# Patient Record
Sex: Female | Born: 1945 | Race: White | Hispanic: No | Marital: Single | Smoking: Former smoker
Health system: Southern US, Community
[De-identification: ages and names within clinical notes are randomized; demographics above are authoritative.]

## PROBLEM LIST (undated history)

## (undated) DIAGNOSIS — R011 Cardiac murmur, unspecified: Secondary | ICD-10-CM

## (undated) DIAGNOSIS — K279 Peptic ulcer, site unspecified, unspecified as acute or chronic, without hemorrhage or perforation: Secondary | ICD-10-CM

## (undated) DIAGNOSIS — I219 Acute myocardial infarction, unspecified: Secondary | ICD-10-CM

## (undated) DIAGNOSIS — E079 Disorder of thyroid, unspecified: Secondary | ICD-10-CM

## (undated) DIAGNOSIS — K219 Gastro-esophageal reflux disease without esophagitis: Secondary | ICD-10-CM

## (undated) DIAGNOSIS — K000 Anodontia: Secondary | ICD-10-CM

## (undated) DIAGNOSIS — K08109 Complete loss of teeth, unspecified cause, unspecified class: Secondary | ICD-10-CM

## (undated) DIAGNOSIS — I2510 Atherosclerotic heart disease of native coronary artery without angina pectoris: Secondary | ICD-10-CM

## (undated) DIAGNOSIS — R809 Proteinuria, unspecified: Secondary | ICD-10-CM

## (undated) DIAGNOSIS — N182 Chronic kidney disease, stage 2 (mild): Secondary | ICD-10-CM

## (undated) DIAGNOSIS — I10 Essential (primary) hypertension: Secondary | ICD-10-CM

## (undated) HISTORY — PX: TONSILLECTOMY: SUR1361

## (undated) HISTORY — PX: APPENDECTOMY: SHX54

## (undated) HISTORY — PX: CORONARY ANGIOPLASTY WITH STENT PLACEMENT: SHX49

## (undated) HISTORY — PX: TUBAL LIGATION: SHX77

---

## 2014-12-18 ENCOUNTER — Encounter (HOSPITAL_BASED_OUTPATIENT_CLINIC_OR_DEPARTMENT_OTHER): Payer: Self-pay

## 2014-12-18 ENCOUNTER — Emergency Department (HOSPITAL_BASED_OUTPATIENT_CLINIC_OR_DEPARTMENT_OTHER): Payer: Medicare HMO

## 2014-12-18 ENCOUNTER — Emergency Department (HOSPITAL_BASED_OUTPATIENT_CLINIC_OR_DEPARTMENT_OTHER)
Admission: EM | Admit: 2014-12-18 | Discharge: 2014-12-19 | Disposition: A | Discharge status: Home / Self Care | Payer: Medicare HMO | Attending: Emergency Medicine | Admitting: Emergency Medicine

## 2014-12-18 DIAGNOSIS — Z87891 Personal history of nicotine dependence: Secondary | ICD-10-CM | POA: Insufficient documentation

## 2014-12-18 DIAGNOSIS — I129 Hypertensive chronic kidney disease with stage 1 through stage 4 chronic kidney disease, or unspecified chronic kidney disease: Secondary | ICD-10-CM | POA: Diagnosis not present

## 2014-12-18 DIAGNOSIS — Z8711 Personal history of peptic ulcer disease: Secondary | ICD-10-CM | POA: Insufficient documentation

## 2014-12-18 DIAGNOSIS — R509 Fever, unspecified: Secondary | ICD-10-CM

## 2014-12-18 DIAGNOSIS — I2510 Atherosclerotic heart disease of native coronary artery without angina pectoris: Secondary | ICD-10-CM | POA: Insufficient documentation

## 2014-12-18 DIAGNOSIS — N390 Urinary tract infection, site not specified: Secondary | ICD-10-CM | POA: Diagnosis not present

## 2014-12-18 DIAGNOSIS — M791 Myalgia: Secondary | ICD-10-CM | POA: Insufficient documentation

## 2014-12-18 DIAGNOSIS — N182 Chronic kidney disease, stage 2 (mild): Secondary | ICD-10-CM | POA: Insufficient documentation

## 2014-12-18 DIAGNOSIS — Z79899 Other long term (current) drug therapy: Secondary | ICD-10-CM | POA: Insufficient documentation

## 2014-12-18 DIAGNOSIS — I252 Old myocardial infarction: Secondary | ICD-10-CM | POA: Diagnosis not present

## 2014-12-18 DIAGNOSIS — H578 Other specified disorders of eye and adnexa: Secondary | ICD-10-CM | POA: Insufficient documentation

## 2014-12-18 DIAGNOSIS — E079 Disorder of thyroid, unspecified: Secondary | ICD-10-CM | POA: Insufficient documentation

## 2014-12-18 DIAGNOSIS — Z9861 Coronary angioplasty status: Secondary | ICD-10-CM | POA: Insufficient documentation

## 2014-12-18 DIAGNOSIS — Z792 Long term (current) use of antibiotics: Secondary | ICD-10-CM | POA: Diagnosis not present

## 2014-12-18 DIAGNOSIS — J069 Acute upper respiratory infection, unspecified: Secondary | ICD-10-CM

## 2014-12-18 DIAGNOSIS — K219 Gastro-esophageal reflux disease without esophagitis: Secondary | ICD-10-CM | POA: Diagnosis not present

## 2014-12-18 DIAGNOSIS — Z880 Allergy status to penicillin: Secondary | ICD-10-CM | POA: Insufficient documentation

## 2014-12-18 DIAGNOSIS — R011 Cardiac murmur, unspecified: Secondary | ICD-10-CM | POA: Insufficient documentation

## 2014-12-18 HISTORY — DX: Complete loss of teeth, unspecified cause, unspecified class: K08.109

## 2014-12-18 HISTORY — DX: Proteinuria, unspecified: R80.9

## 2014-12-18 HISTORY — DX: Atherosclerotic heart disease of native coronary artery without angina pectoris: I25.10

## 2014-12-18 HISTORY — DX: Anodontia: K00.0

## 2014-12-18 HISTORY — DX: Disorder of thyroid, unspecified: E07.9

## 2014-12-18 HISTORY — DX: Acute myocardial infarction, unspecified: I21.9

## 2014-12-18 HISTORY — DX: Gastro-esophageal reflux disease without esophagitis: K21.9

## 2014-12-18 HISTORY — DX: Cardiac murmur, unspecified: R01.1

## 2014-12-18 HISTORY — DX: Essential (primary) hypertension: I10

## 2014-12-18 HISTORY — DX: Chronic kidney disease, stage 2 (mild): N18.2

## 2014-12-18 HISTORY — DX: Peptic ulcer, site unspecified, unspecified as acute or chronic, without hemorrhage or perforation: K27.9

## 2014-12-18 LAB — CBC WITH DIFFERENTIAL/PLATELET
BASOS ABS: 0 10*3/uL (ref 0.0–0.1)
Basophils Relative: 0 %
EOS ABS: 0 10*3/uL (ref 0.0–0.7)
EOS PCT: 0 %
HCT: 38.4 % (ref 36.0–46.0)
HEMOGLOBIN: 12.7 g/dL (ref 12.0–15.0)
LYMPHS ABS: 1.4 10*3/uL (ref 0.7–4.0)
LYMPHS PCT: 14 %
MCH: 26.9 pg (ref 26.0–34.0)
MCHC: 33.1 g/dL (ref 30.0–36.0)
MCV: 81.4 fL (ref 78.0–100.0)
Monocytes Absolute: 1.2 10*3/uL — ABNORMAL HIGH (ref 0.1–1.0)
Monocytes Relative: 12 %
NEUTROS PCT: 74 %
Neutro Abs: 7.4 10*3/uL (ref 1.7–7.7)
PLATELETS: 164 10*3/uL (ref 150–400)
RBC: 4.72 MIL/uL (ref 3.87–5.11)
RDW: 15.5 % (ref 11.5–15.5)
WBC: 10.1 10*3/uL (ref 4.0–10.5)

## 2014-12-18 LAB — URINALYSIS, ROUTINE W REFLEX MICROSCOPIC
Bilirubin Urine: NEGATIVE
Glucose, UA: NEGATIVE mg/dL
Ketones, ur: NEGATIVE mg/dL
Nitrite: POSITIVE — AB
SPECIFIC GRAVITY, URINE: 1.015 (ref 1.005–1.030)
pH: 6 (ref 5.0–8.0)

## 2014-12-18 LAB — COMPREHENSIVE METABOLIC PANEL
ALT: 21 U/L (ref 14–54)
AST: 30 U/L (ref 15–41)
Albumin: 3.5 g/dL (ref 3.5–5.0)
Alkaline Phosphatase: 89 U/L (ref 38–126)
Anion gap: 10 (ref 5–15)
BILIRUBIN TOTAL: 1 mg/dL (ref 0.3–1.2)
BUN: 19 mg/dL (ref 6–20)
CHLORIDE: 99 mmol/L — AB (ref 101–111)
CO2: 20 mmol/L — ABNORMAL LOW (ref 22–32)
Calcium: 8.7 mg/dL — ABNORMAL LOW (ref 8.9–10.3)
Creatinine, Ser: 1.02 mg/dL — ABNORMAL HIGH (ref 0.44–1.00)
GFR, EST NON AFRICAN AMERICAN: 55 mL/min — AB (ref >60)
Glucose, Bld: 122 mg/dL — ABNORMAL HIGH (ref 65–99)
POTASSIUM: 3.7 mmol/L (ref 3.5–5.1)
Sodium: 129 mmol/L — ABNORMAL LOW (ref 135–145)
TOTAL PROTEIN: 7.8 g/dL (ref 6.5–8.1)

## 2014-12-18 LAB — URINE MICROSCOPIC-ADD ON

## 2014-12-18 LAB — I-STAT CG4 LACTIC ACID, ED
LACTIC ACID, VENOUS: 1.43 mmol/L (ref 0.5–2.0)
LACTIC ACID, VENOUS: 1.08 mmol/L (ref 0.5–2.0)

## 2014-12-18 MED ORDER — VANCOMYCIN HCL 500 MG IV SOLR
INTRAVENOUS | Status: AC
  Filled 2014-12-18: qty 2000

## 2014-12-18 MED ORDER — VANCOMYCIN HCL 10 G IV SOLR
2000.0000 mg | Freq: Once | INTRAVENOUS | Status: AC
  Administered 2014-12-18: 2000 mg via INTRAVENOUS
  Filled 2014-12-18: qty 2000

## 2014-12-18 MED ORDER — LEVOFLOXACIN IN D5W 750 MG/150ML IV SOLN: 750.0000 mg | INTRAVENOUS | Status: DC

## 2014-12-18 MED ORDER — AZTREONAM 1 G IJ SOLR
INTRAMUSCULAR | Status: AC
  Administered 2014-12-18: 2 g
  Filled 2014-12-18: qty 2

## 2014-12-18 MED ORDER — ONDANSETRON 4 MG PO TBDP: 4.0000 mg | ORAL_TABLET | Freq: Three times a day (TID) | ORAL | Status: DC | PRN

## 2014-12-18 MED ORDER — SODIUM CHLORIDE 0.9 % IV SOLN
1250.0000 mg | Freq: Two times a day (BID) | INTRAVENOUS | Status: DC
  Filled 2014-12-18: qty 1250

## 2014-12-18 MED ORDER — ACETAMINOPHEN 325 MG PO TABS
650.0000 mg | ORAL_TABLET | Freq: Once | ORAL | Status: AC
  Administered 2014-12-18: 650 mg via ORAL
  Filled 2014-12-18: qty 2

## 2014-12-18 MED ORDER — VANCOMYCIN HCL IN DEXTROSE 1-5 GM/200ML-% IV SOLN: 1000.0000 mg | Freq: Once | INTRAVENOUS | Status: DC

## 2014-12-18 MED ORDER — DEXTROSE 5 % IV SOLN: 2.0000 g | Freq: Three times a day (TID) | INTRAVENOUS | Status: DC

## 2014-12-18 MED ORDER — LEVOFLOXACIN IN D5W 750 MG/150ML IV SOLN
750.0000 mg | Freq: Once | INTRAVENOUS | Status: AC
  Administered 2014-12-18: 750 mg via INTRAVENOUS
  Filled 2014-12-18: qty 150

## 2014-12-18 MED ORDER — CEPHALEXIN 500 MG PO CAPS: 500.0000 mg | ORAL_CAPSULE | Freq: Four times a day (QID) | ORAL | Status: DC

## 2014-12-18 MED ORDER — AZTREONAM 1 G IJ SOLR
INTRAMUSCULAR | Status: AC
  Filled 2014-12-18: qty 1

## 2014-12-18 MED ORDER — SODIUM CHLORIDE 0.9 % IV BOLUS (SEPSIS)
1000.0000 mL | INTRAVENOUS | Status: AC
  Administered 2014-12-18: 1000 mL via INTRAVENOUS

## 2014-12-18 MED ORDER — DEXTROSE 5 % IV SOLN
2.0000 g | Freq: Once | INTRAVENOUS | Status: AC
  Administered 2014-12-18: 2 g via INTRAVENOUS

## 2014-12-18 NOTE — ED Notes (Signed)
Reports that she believed she had a UTI. C/o dysuria, flank pain with malodorous urine since Saturday night.

## 2014-12-18 NOTE — Discharge Instructions (Signed)
Take antibiotic as directed. If your urinary tract infection symptoms of pain with urination do not resolve in 2 days return because in the advice not working. There was no evidence of pneumonia and the rest of your workup in the believe it may be a viral upper respiratory infection. Also return for any new or worse symptoms with that. But the antibiotic will not help that. Take antinausea medicine as directed. Again return for any new or worse symptoms or if not feeling better in a couple days.

## 2014-12-18 NOTE — ED Provider Notes (Signed)
CSN: [id number]     Arrival date & time 12/18/14  1729 History  By signing my name below, I, Rhonda Burnett, attest that this documentation has been prepared under the direction and in the presence of Rhonda Sorrow, MD. Electronically Signed: Meriel Burnett, ED Scribe. 12/18/2014. 7:26 PM.    Chief Complaint  Patient presents with  . Fever   The history is provided by the patient. No language interpreter was used.   HPI Comments: Rhonda Burnett is a 69 y.o. female, with a significant PMhx with cardiac stents, who presents to the Emergency Department complaining of an intermittent fever onset 1 day ago that worsened last night with onset of associated chills and a Tmax of 1004.1F. She associates chest congestion, a productive cough, nausea, vomiting, rhinorrhea and generalized myalgias. She additionally c/o burning dysuria X 3 days. Pt is febrile at 102.62F on triage vitals. She received her influenza and PNA vaccinations 4 months ago. The pt admits to taking hypertension medication daily and reports her BP runs [id number] systolic at baseline.    Past Medical History  Diagnosis Date  . MI (myocardial infarction) ([place])   . Hypertension   . Thyroid disease   . GERD (gastroesophageal reflux disease)   . Peptic ulcer   . Coronary artery disease   . CKD (chronic kidney disease) stage 2, GFR 60-89 ml/min   . Edentulous   . Heart murmur   . Microalbuminuria    Past Surgical History  Procedure Laterality Date  . Coronary angioplasty with stent placement    . Appendectomy    . Tonsillectomy    . Tubal ligation     No family history on file. Social History  Substance Use Topics  . Smoking status: Former Research scientist (life sciences)  . Smokeless tobacco: None  . Alcohol Use: Yes     Comment: rare   OB History    No data available     Review of Systems  Constitutional: Positive for fever and chills.  HENT: Positive for congestion and rhinorrhea. Negative for sore throat.   Eyes: Positive for visual  disturbance ( 'I could not focus').  Respiratory: Positive for cough and shortness of breath ( on exertion).   Cardiovascular: Negative for chest pain and leg swelling.  Gastrointestinal: Positive for nausea and vomiting. Negative for abdominal pain and diarrhea.  Genitourinary: Positive for dysuria ( burning). Negative for hematuria.  Musculoskeletal: Positive for myalgias ( generalized). Negative for back pain and neck pain.  Skin: Negative for rash.  Neurological: Positive for headaches ( intermittent).  Hematological: Does not bruise/bleed easily.  Psychiatric/Behavioral: Negative for confusion.   Allergies  Asa; Iodine; and Penicillins  Home Medications   Prior to Admission medications   Medication Sig Start Date End Date Taking? Authorizing Provider  amLODipine (NORVASC) 5 MG tablet Take 5 mg by mouth daily.   Yes Historical Provider, MD  colesevelam (WELCHOL) 625 MG tablet Take 625 mg by mouth 2 (two) times daily with a meal.   Yes Historical Provider, MD  diphenhydrAMINE (SOMINEX) 25 MG tablet Take 25 mg by mouth at bedtime as needed for sleep.   Yes Historical Provider, MD  doxycycline (VIBRAMYCIN) 100 MG capsule Take 100 mg by mouth 2 (two) times daily.   Yes Historical Provider, MD  famotidine (PEPCID) 20 MG tablet Take 20 mg by mouth 2 (two) times daily.   Yes Historical Provider, MD  levothyroxine (SYNTHROID, LEVOTHROID) 25 MCG tablet Take 25 mcg by mouth daily before breakfast.   Yes Historical  Provider, MD  losartan-hydrochlorothiazide (HYZAAR) 100-25 MG tablet Take 1 tablet by mouth daily.   Yes Historical Provider, MD  metoprolol succinate (TOPROL-XL) 50 MG 24 hr tablet Take 50 mg by mouth daily. Take with or immediately following a meal.   Yes Historical Provider, MD  nitroGLYCERIN (NITROSTAT) 0.4 MG SL tablet Place 0.4 mg under the tongue every 5 (five) minutes as needed for chest pain.   Yes Historical Provider, MD  omeprazole (PRILOSEC) 20 MG capsule Take 20 mg by  mouth daily.   Yes Historical Provider, MD  cephALEXin (KEFLEX) 500 MG capsule Take 1 capsule (500 mg total) by mouth 4 (four) times daily. 12/18/14   Rhonda Sorrow, MD  ondansetron (ZOFRAN ODT) 4 MG disintegrating tablet Take 1 tablet (4 mg total) by mouth every 8 (eight) hours as needed for nausea or vomiting. 12/18/14   Rhonda Sorrow, MD   Triage Vitals: BP 147/67 mmHg  Pulse 99  Temp(Src) 102.3 F (39.1 C) (Oral)  Resp 28  Ht 5\' 7"  (1.702 m)  Wt 209 lb (94.802 kg)  BMI 32.73 kg/m2  SpO2 98% Physical Exam  Constitutional: She is oriented to person, place, and time. She appears well-developed and well-nourished. No distress.  HENT:  Head: Normocephalic and atraumatic.  Mucous membranes dry.   Eyes: Conjunctivae and EOM are normal. Pupils are equal, round, and reactive to light. No scleral icterus.  Neck: Normal range of motion.  Cardiovascular: Normal rate, regular rhythm and normal heart sounds.   Pulmonary/Chest: Effort normal and breath sounds normal. No respiratory distress. She has no wheezes. She has no rales.  Good air movement bilaterally. No swelling in bilateral ankles.   Abdominal: Soft. Bowel sounds are normal. She exhibits no distension. There is no tenderness.  Musculoskeletal: Normal range of motion.  Neurological: She is alert and oriented to person, place, and time. No cranial nerve deficit. She exhibits normal muscle tone. Coordination normal.  Skin: Skin is warm and dry.  Psychiatric: She has a normal mood and affect. Judgment normal.  Nursing note and vitals reviewed.   ED Course  Procedures  DIAGNOSTIC STUDIES: Oxygen Saturation is 98% on RA, normal by my interpretation.    COORDINATION OF CARE: 6:21 PM Code sepsis initiated.  7:09 PM Discussed treatment plan with pt. Pt acknowledges and agrees to plan.   Labs Review Labs Reviewed  COMPREHENSIVE METABOLIC PANEL - Abnormal; Notable for the following:    Sodium 129 (*)    Chloride 99 (*)    CO2 20  (*)    Glucose, Bld 122 (*)    Creatinine, Ser 1.02 (*)    Calcium 8.7 (*)    GFR calc non Af Amer 55 (*)    All other components within normal limits  CBC WITH DIFFERENTIAL/PLATELET - Abnormal; Notable for the following:    Monocytes Absolute 1.2 (*)    All other components within normal limits  URINALYSIS, ROUTINE W REFLEX MICROSCOPIC (NOT AT [facility]) - Abnormal; Notable for the following:    Color, Urine AMBER (*)    APPearance TURBID (*)    Hgb urine dipstick MODERATE (*)    Protein, ur >300 (*)    Nitrite POSITIVE (*)    Leukocytes, UA LARGE (*)    All other components within normal limits  URINE MICROSCOPIC-ADD ON - Abnormal; Notable for the following:    Squamous Epithelial / LPF 6-30 (*)    Bacteria, UA MANY (*)    All other components within normal limits  CULTURE,  BLOOD (ROUTINE X 2)  CULTURE, BLOOD (ROUTINE X 2)  URINE CULTURE  I-STAT CG4 LACTIC ACID, ED  I-STAT CG4 LACTIC ACID, ED   Results for orders placed or performed during the hospital encounter of 12/18/14  Comprehensive metabolic panel  Result Value Ref Range   Sodium 129 (L) 135 - 145 mmol/L   Potassium 3.7 3.5 - 5.1 mmol/L   Chloride 99 (L) 101 - 111 mmol/L   CO2 20 (L) 22 - 32 mmol/L   Glucose, Bld 122 (H) 65 - 99 mg/dL   BUN 19 6 - 20 mg/dL   Creatinine, Ser 1.02 (H) 0.44 - 1.00 mg/dL   Calcium 8.7 (L) 8.9 - 10.3 mg/dL   Total Protein 7.8 6.5 - 8.1 g/dL   Albumin 3.5 3.5 - 5.0 g/dL   AST 30 15 - 41 U/L   ALT 21 14 - 54 U/L   Alkaline Phosphatase 89 38 - 126 U/L   Total Bilirubin 1.0 0.3 - 1.2 mg/dL   GFR calc non Af Amer 55 (L) >60 mL/min   GFR calc Af Amer >60 >60 mL/min   Anion gap 10 5 - 15  CBC WITH DIFFERENTIAL  Result Value Ref Range   WBC 10.1 4.0 - 10.5 K/uL   RBC 4.72 3.87 - 5.11 MIL/uL   Hemoglobin 12.7 12.0 - 15.0 g/dL   HCT 38.4 36.0 - 46.0 %   MCV 81.4 78.0 - 100.0 fL   MCH 26.9 26.0 - 34.0 pg   MCHC 33.1 30.0 - 36.0 g/dL   RDW 15.5 11.5 - 15.5 %   Platelets 164 150 - 400 K/uL    Neutrophils Relative % 74 %   Neutro Abs 7.4 1.7 - 7.7 K/uL   Lymphocytes Relative 14 %   Lymphs Abs 1.4 0.7 - 4.0 K/uL   Monocytes Relative 12 %   Monocytes Absolute 1.2 (H) 0.1 - 1.0 K/uL   Eosinophils Relative 0 %   Eosinophils Absolute 0.0 0.0 - 0.7 K/uL   Basophils Relative 0 %   Basophils Absolute 0.0 0.0 - 0.1 K/uL  Urinalysis, Routine w reflex microscopic (not at [facility])  Result Value Ref Range   Color, Urine AMBER (A) YELLOW   APPearance TURBID (A) CLEAR   Specific Gravity, Urine 1.015 1.005 - 1.030   pH 6.0 5.0 - 8.0   Glucose, UA NEGATIVE NEGATIVE mg/dL   Hgb urine dipstick MODERATE (A) NEGATIVE   Bilirubin Urine NEGATIVE NEGATIVE   Ketones, ur NEGATIVE NEGATIVE mg/dL   Protein, ur >300 (A) NEGATIVE mg/dL   Nitrite POSITIVE (A) NEGATIVE   Leukocytes, UA LARGE (A) NEGATIVE  Urine microscopic-add on  Result Value Ref Range   Squamous Epithelial / LPF 6-30 (A) NONE SEEN   WBC, UA TOO NUMEROUS TO COUNT 0 - 5 WBC/hpf   RBC / HPF 6-30 0 - 5 RBC/hpf   Bacteria, UA MANY (A) NONE SEEN  I-Stat CG4 Lactic Acid, ED  (not at  [facility])  Result Value Ref Range   Lactic Acid, Venous 1.43 0.5 - 2.0 mmol/L  I-Stat CG4 Lactic Acid, ED  (not at  [facility])  Result Value Ref Range   Lactic Acid, Venous 1.08 0.5 - 2.0 mmol/L     Imaging Review Dg Chest 2 View  12/18/2014  CLINICAL DATA:  Cough, fever and shortness of breath. EXAM: CHEST  2 VIEW COMPARISON:  None. FINDINGS: Normal sized heart. Clear lungs. Mild diffuse peribronchial thickening. Unremarkable bones. IMPRESSION: Mild bronchitic changes. Electronically Signed  By: Claudie Revering M.D.   On: 12/18/2014 18:52   Ct Chest Wo Contrast  12/18/2014  CLINICAL DATA:  Acute onset of cough, fever and lower back pain. Initial encounter. EXAM: CT CHEST WITHOUT CONTRAST TECHNIQUE: Multidetector CT imaging of the chest was performed following the standard protocol without IV contrast. COMPARISON:  Chest radiograph performed earlier today at  6:37 p.m. FINDINGS: Two 8 mm nodules are noted within the right lower lobe (images 38 and 44 of 56). Additional scattered tiny nodules are noted throughout both lungs. This most likely reflects sequelae of remote infection, though metastatic disease could conceivably have a similar appearance. The lungs are otherwise clear. No focal consolidation, pleural effusion or pneumothorax is seen. Diffuse coronary artery calcifications are noted. The mediastinum is otherwise unremarkable. No mediastinal lymphadenopathy is seen. No pericardial effusion is identified. Scattered calcification is noted along the thoracic aorta. The great vessels are grossly unremarkable. The visualized portions of thyroid gland are unremarkable. No axillary lymphadenopathy is seen. The spleen is enlarged, measuring 15.3 cm in length. The visualized portions of the liver are grossly unremarkable. No acute osseous abnormalities are seen. IMPRESSION: 1. Scattered tiny nodules throughout both lungs, including two 8 mm nodules at the right lower lobe. This most likely reflects sequelae of remote infection, though metastatic disease could conceivably have a similar appearance. PET/CT could be considered for further evaluation, on an elective nonemergent basis. 2. Lungs otherwise clear. 3. Diffuse coronary artery calcifications seen. 4. Splenomegaly noted. Electronically Signed   By: Garald Balding M.D.   On: 12/18/2014 19:56   I have personally reviewed and evaluated these images and lab results as part of my medical decision-making.   EKG Interpretation   Date/Time:  Monday December 18 2014 18:42:35 EST Ventricular Rate:  88 PR Interval:  180 QRS Duration: 98 QT Interval:  376 QTC Calculation: [id number] R Axis:   44 Text Interpretation:  Normal sinus rhythm Left ventricular hypertrophy  with repolarization abnormality Abnormal ECG No previous ECGs available  Confirmed by Redmond Whittley  MD, Arya Luttrull [phone]) on 12/18/2014 6:48:56 PM      MDM    Final diagnoses:  Fever, unspecified fever cause  UTI (lower urinary tract infection)  URI (upper respiratory infection)    Patient presented with seem to be like flulike symptoms. Fever some concerns perhaps for early sepsis. Sepsis protocol started. Patient's lactic acid was not elevated. Chest x-ray was negative for pneumonia. CT chest was done for confirmation. Patient does have some pulmonary nodules that will require follow-up. Patient urinalysis significantly abnormal. Treatment for this will be required. Urine culture sent patient did have blood cultures patient to give broad-spectrum antibiotics due to the concern for sepsis that should initially cover the urinary tract infection. Patient will be continued on Keflex does have an allergy to penicillin but has not had trouble with cephalosporins in the past. Patient's sodium was slightly low patient showed some evidence of some mild dehydration. Patient never had hypotension or significant tachycardia. Patient overall states she feels much much better once to go home. Will discharge home continue treating the urinary tract infection with Keflex and will treat her nausea and vomiting with Zofran. Believe that the upper rest for infection is probably viral. And obviously the urinary tract infection is bacterial.    I personally performed the services described in this documentation, which was scribed in my presence. The recorded information has been reviewed and is accurate.      Rhonda Sorrow, MD 12/18/14 [phone]

## 2014-12-18 NOTE — Progress Notes (Signed)
ANTIBIOTIC CONSULT NOTE - INITIAL  Pharmacy Consult for vancomycin, aztreonam, levaquin Indication: sepsis  Allergies  Allergen Reactions  . Asa [Aspirin]   . Iodine   . Penicillins     Patient Measurements: Height: 5\' 7"  (170.2 cm) Weight: 209 lb (94.802 kg) IBW/kg (Calculated) : 61.6  Vital Signs: Temp: 102.3 F (39.1 C) (11/28 1749) Temp Source: Oral (11/28 1749) BP: 147/67 mmHg (11/28 1749) Pulse Rate: 99 (11/28 1749) Intake/Output from previous day:   Intake/Output from this shift:    Labs: No results for input(s): WBC, HGB, PLT, LABCREA, CREATININE in the last 72 hours. CrCl cannot be calculated (Patient has no serum creatinine result on file.). No results for input(s): VANCOTROUGH, VANCOPEAK, VANCORANDOM, GENTTROUGH, GENTPEAK, GENTRANDOM, TOBRATROUGH, TOBRAPEAK, TOBRARND, AMIKACINPEAK, AMIKACINTROU, AMIKACIN in the last 72 hours.   Microbiology: No results found for this or any previous visit (from the past 720 hour(s)).  Medical History: Past Medical History  Diagnosis Date  . MI (myocardial infarction) ([place])   . Hypertension   . Thyroid disease   . GERD (gastroesophageal reflux disease)   . Peptic ulcer   . Coronary artery disease   . CKD (chronic kidney disease) stage 2, GFR 60-89 ml/min   . Edentulous   . Heart murmur   . Microalbuminuria     Assessment: 69 yo f presenting to the ED on 11/28 with fever, productive cough, and dysuria. Pharmacy is consulted to dose vancomycin, aztreonam (PCN allergy), and levaquin for sepsis. Patient has already received loads in the ED.  Wbc 10.1, tmax 102.3, SCr 1.02, CrCl ~62 ml/min, LA 1.43.   Vanc 11/28 >> Aztreonam 11/28 >> Levaquin 11/28 >>  11/28 BCx: 11/28 UCx:  Goal of Therapy:  Vancomycin trough level 15-20 mcg/ml  Plan:  Levaquin 750 mg IV q24h Aztreonam 2 gm IV q8h Vancomycin 1250 mg IV q12h VT at Css Monitor renal fx, cbc, cx, clinical course  Jahnavi Muratore L. Nicole Kindred, PharmD PGY2 Infectious  Diseases Pharmacy Resident Pager: [phone] 12/18/2014 7:18 PM

## 2014-12-18 NOTE — ED Notes (Signed)
Fever, prod cough, dysuria, lower back pain x 2 days

## 2014-12-19 ENCOUNTER — Telehealth (HOSPITAL_COMMUNITY): Payer: Self-pay

## 2014-12-19 NOTE — Telephone Encounter (Signed)
Dr Rogene Houston reviewed chart. Advised to have pt to return for further evaluation if continues to have fevers or feeling worse. Attempted to contact pt. Left message on answering machine

## 2014-12-19 NOTE — Telephone Encounter (Signed)
Spoke with pt. Informed of labs. States she is feeling much better. No fever today. No vomiting.  Will follow up with PCP tomorrow. If fever returns or symptoms come back she will return to Ed

## 2014-12-20 LAB — URINE CULTURE: Culture: >=100000

## 2014-12-21 ENCOUNTER — Telehealth (HOSPITAL_BASED_OUTPATIENT_CLINIC_OR_DEPARTMENT_OTHER): Payer: Self-pay | Admitting: Emergency Medicine

## 2014-12-21 LAB — CULTURE, BLOOD (ROUTINE X 2)

## 2014-12-21 NOTE — Telephone Encounter (Signed)
Post ED Visit - Positive Culture Follow-up  Culture report reviewed by antimicrobial stewardship pharmacist:  []  Elenor Quinones, Pharm.D. []  Heide Guile, Pharm.D., BCPS [x]  Parks Neptune, Pharm.D. []  Alycia Rossetti, Pharm.D., BCPS []  [place], Pharm.D., BCPS, AAHIVP []  Legrand Como, Pharm.D., BCPS, AAHIVP []  Milus Glazier, Pharm.D. []  Stephens November, [place].D.  Positive urine culture E. coli Treated with doxycycline and cephalexin, organism sensitive to the same and no further patient follow-up is required at this time.  Hazle Nordmann 12/21/2014, 11:29 AM

## 2014-12-22 ENCOUNTER — Emergency Department (HOSPITAL_BASED_OUTPATIENT_CLINIC_OR_DEPARTMENT_OTHER)
Admission: EM | Admit: 2014-12-22 | Discharge: 2014-12-22 | Disposition: A | Discharge status: Home / Self Care | Payer: Medicare HMO | Attending: Emergency Medicine | Admitting: Emergency Medicine

## 2014-12-22 ENCOUNTER — Telehealth (HOSPITAL_COMMUNITY): Payer: Self-pay | Admitting: Pharmacist Clinician (PhC)/ Clinical Pharmacy Specialist

## 2014-12-22 ENCOUNTER — Encounter (HOSPITAL_BASED_OUTPATIENT_CLINIC_OR_DEPARTMENT_OTHER): Payer: Self-pay | Admitting: *Deleted

## 2014-12-22 DIAGNOSIS — Z8711 Personal history of peptic ulcer disease: Secondary | ICD-10-CM | POA: Insufficient documentation

## 2014-12-22 DIAGNOSIS — Z87891 Personal history of nicotine dependence: Secondary | ICD-10-CM | POA: Insufficient documentation

## 2014-12-22 DIAGNOSIS — N12 Tubulo-interstitial nephritis, not specified as acute or chronic: Secondary | ICD-10-CM | POA: Insufficient documentation

## 2014-12-22 DIAGNOSIS — E079 Disorder of thyroid, unspecified: Secondary | ICD-10-CM | POA: Insufficient documentation

## 2014-12-22 DIAGNOSIS — I129 Hypertensive chronic kidney disease with stage 1 through stage 4 chronic kidney disease, or unspecified chronic kidney disease: Secondary | ICD-10-CM | POA: Insufficient documentation

## 2014-12-22 DIAGNOSIS — Z792 Long term (current) use of antibiotics: Secondary | ICD-10-CM | POA: Insufficient documentation

## 2014-12-22 DIAGNOSIS — N182 Chronic kidney disease, stage 2 (mild): Secondary | ICD-10-CM | POA: Diagnosis not present

## 2014-12-22 DIAGNOSIS — Z0000 Encounter for general adult medical examination without abnormal findings: Secondary | ICD-10-CM | POA: Diagnosis not present

## 2014-12-22 DIAGNOSIS — I252 Old myocardial infarction: Secondary | ICD-10-CM | POA: Insufficient documentation

## 2014-12-22 DIAGNOSIS — K219 Gastro-esophageal reflux disease without esophagitis: Secondary | ICD-10-CM | POA: Diagnosis not present

## 2014-12-22 DIAGNOSIS — Z79899 Other long term (current) drug therapy: Secondary | ICD-10-CM | POA: Insufficient documentation

## 2014-12-22 DIAGNOSIS — Z09 Encounter for follow-up examination after completed treatment for conditions other than malignant neoplasm: Secondary | ICD-10-CM

## 2014-12-22 DIAGNOSIS — R011 Cardiac murmur, unspecified: Secondary | ICD-10-CM | POA: Diagnosis not present

## 2014-12-22 DIAGNOSIS — Z880 Allergy status to penicillin: Secondary | ICD-10-CM | POA: Diagnosis not present

## 2014-12-22 LAB — COMPREHENSIVE METABOLIC PANEL
ALT: 24 U/L (ref 14–54)
AST: 32 U/L (ref 15–41)
Albumin: 3 g/dL — ABNORMAL LOW (ref 3.5–5.0)
Alkaline Phosphatase: 79 U/L (ref 38–126)
Anion gap: 6 (ref 5–15)
BUN: 11 mg/dL (ref 6–20)
CHLORIDE: 106 mmol/L (ref 101–111)
CO2: 24 mmol/L (ref 22–32)
CREATININE: 0.72 mg/dL (ref 0.44–1.00)
Calcium: 9 mg/dL (ref 8.9–10.3)
GFR calc non Af Amer: >60 mL/min (ref >60)
Glucose, Bld: 85 mg/dL (ref 65–99)
Potassium: 4 mmol/L (ref 3.5–5.1)
SODIUM: 136 mmol/L (ref 135–145)
Total Bilirubin: 0.6 mg/dL (ref 0.3–1.2)
Total Protein: 7.3 g/dL (ref 6.5–8.1)

## 2014-12-22 LAB — URINALYSIS, ROUTINE W REFLEX MICROSCOPIC
Bilirubin Urine: NEGATIVE
Glucose, UA: NEGATIVE mg/dL
Ketones, ur: NEGATIVE mg/dL
LEUKOCYTES UA: NEGATIVE
NITRITE: NEGATIVE
PH: 6.5 (ref 5.0–8.0)
Protein, ur: 100 mg/dL — AB
SPECIFIC GRAVITY, URINE: 1.009 (ref 1.005–1.030)

## 2014-12-22 LAB — URINE MICROSCOPIC-ADD ON

## 2014-12-22 LAB — CBC WITH DIFFERENTIAL/PLATELET
BASOS ABS: 0 10*3/uL (ref 0.0–0.1)
BASOS PCT: 0 %
EOS ABS: 0.1 10*3/uL (ref 0.0–0.7)
EOS PCT: 3 %
HCT: 35.4 % — ABNORMAL LOW (ref 36.0–46.0)
Hemoglobin: 11.7 g/dL — ABNORMAL LOW (ref 12.0–15.0)
LYMPHS ABS: 1.1 10*3/uL (ref 0.7–4.0)
Lymphocytes Relative: 22 %
MCH: 26.8 pg (ref 26.0–34.0)
MCHC: 33.1 g/dL (ref 30.0–36.0)
MCV: 81.2 fL (ref 78.0–100.0)
Monocytes Absolute: 0.5 10*3/uL (ref 0.1–1.0)
Monocytes Relative: 10 %
Neutro Abs: 3.4 10*3/uL (ref 1.7–7.7)
Neutrophils Relative %: 65 %
PLATELETS: 151 10*3/uL (ref 150–400)
RBC: 4.36 MIL/uL (ref 3.87–5.11)
RDW: 15.5 % (ref 11.5–15.5)
WBC: 5.2 10*3/uL (ref 4.0–10.5)

## 2014-12-22 LAB — I-STAT CG4 LACTIC ACID, ED: Lactic Acid, Venous: 1.05 mmol/L (ref 0.5–2.0)

## 2014-12-22 MED ORDER — CIPROFLOXACIN HCL 500 MG PO TABS: 500.0000 mg | ORAL_TABLET | Freq: Two times a day (BID) | ORAL | Status: AC

## 2014-12-22 NOTE — Progress Notes (Signed)
ED Antimicrobial Stewardship Positive Culture Follow Up   Rhonda Burnett is an 69 y.o. female who presented to [facility] on (Not on file) with a chief complaint of No chief complaint on file.   Recent Results (from the past 720 hour(s))  Blood Culture (routine x 2)     Status: None   Collection Time: 12/18/14  6:20 PM  Result Value Ref Range Status   Specimen Description BLOOD RIGHT ANTECUBITAL  Final   Special Requests BOTTLES DRAWN AEROBIC AND ANAEROBIC 5CC  Final   Culture  Setup Time   Final    GRAM NEGATIVE RODS ANAEROBIC BOTTLE ONLY CRITICAL RESULT CALLED TO, READ BACK BY AND VERIFIED WITH: T FESTERMAN,RN AT 1414 12/19/14 BY L BENFIELD    Culture   Final    ESCHERICHIA COLI Performed at [facility]    Report Status 12/21/2014 FINAL  Final   Organism ID, Bacteria ESCHERICHIA COLI  Final      Susceptibility   Escherichia coli - MIC*    AMPICILLIN >=32 RESISTANT Resistant     CEFAZOLIN <=4 SENSITIVE Sensitive     CEFEPIME <=1 SENSITIVE Sensitive     CEFTAZIDIME <=1 SENSITIVE Sensitive     CEFTRIAXONE <=1 SENSITIVE Sensitive     CIPROFLOXACIN <=0.25 SENSITIVE Sensitive     GENTAMICIN <=1 SENSITIVE Sensitive     IMIPENEM <=0.25 SENSITIVE Sensitive     TRIMETH/SULFA >=320 RESISTANT Resistant     AMPICILLIN/SULBACTAM 8 SENSITIVE Sensitive     PIP/TAZO <=4 SENSITIVE Sensitive     * ESCHERICHIA COLI  Blood Culture (routine x 2)     Status: None   Collection Time: 12/18/14  6:40 PM  Result Value Ref Range Status   Specimen Description BLOOD LEFT AC  Final   Special Requests BOTTLES DRAWN AEROBIC AND ANAEROBIC 5CC EACH  Final   Culture  Setup Time   Final    GRAM NEGATIVE RODS AEROBIC BOTTLE ONLY CRITICAL RESULT CALLED TO, READ BACK BY AND VERIFIED WITH: T FESTERMAN,RN  AT 1627 12/19/14 BY L BENFIELD    Culture   Final    ESCHERICHIA COLI SUSCEPTIBILITIES PERFORMED ON PREVIOUS CULTURE WITHIN THE LAST 5 DAYS. Performed at [facility]    Report Status  12/21/2014 FINAL  Final  Urine culture     Status: None   Collection Time: 12/18/14  7:20 PM  Result Value Ref Range Status   Specimen Description URINE, CLEAN CATCH  Final   Special Requests NONE  Final   Culture   Final    >=100,000 COLONIES/mL ESCHERICHIA COLI Performed at [facility]    Report Status 12/20/2014 FINAL  Final   Organism ID, Bacteria ESCHERICHIA COLI  Final      Susceptibility   Escherichia coli - MIC*    AMPICILLIN >=32 RESISTANT Resistant     CEFAZOLIN <=4 SENSITIVE Sensitive     CEFTRIAXONE <=1 SENSITIVE Sensitive     CIPROFLOXACIN <=0.25 SENSITIVE Sensitive     GENTAMICIN <=1 SENSITIVE Sensitive     IMIPENEM <=0.25 SENSITIVE Sensitive     NITROFURANTOIN <=16 SENSITIVE Sensitive     TRIMETH/SULFA >=320 RESISTANT Resistant     AMPICILLIN/SULBACTAM 4 SENSITIVE Sensitive     PIP/TAZO <=4 SENSITIVE Sensitive     * >=100,000 COLONIES/mL ESCHERICHIA COLI    69 yo who came back to the ED today to eval her bacteremia. Overall, she is doing better but she was only prescribed for 7 days of Keflex previously and since it's  bacteremia, cephalexin is not the best choice for it. D/w Dr Viviana Simpler today and we are going to change the Keflex to Cipro instead since it has good data for both pyelo/UTI/bacteremia. It's sensitive to Cipro. She was advised that if she has any further symptoms, then return to the ED for further eval. Spoke to the pt directly and instructed her about the change. Called the pharmacy and verified that they have received the scripts.  New antibiotic prescription:   Stop Keflex Start Cipro 500mg  PO BID x 7 days F/u with PCP on Monday  ED Provider: Dr Merideth Abbey, PharmD Pager: [phone] Infectious Diseases Pharmacist Phone# [phone]

## 2014-12-22 NOTE — ED Notes (Signed)
C/o uti since Monday and was seen here for same then and was notified that she had bacteria in blood and was told to come back for a recheck.

## 2014-12-22 NOTE — Progress Notes (Signed)
ED Antimicrobial Stewardship Positive Culture Follow Up   Rhonda Burnett is an 69 y.o. female who presented to [facility] on 12/18/2014 with a chief complaint of  Chief Complaint  Patient presents with  . Fever    Recent Results (from the past 720 hour(s))  Blood Culture (routine x 2)     Status: None   Collection Time: 12/18/14  6:20 PM  Result Value Ref Range Status   Specimen Description BLOOD RIGHT ANTECUBITAL  Final   Special Requests BOTTLES DRAWN AEROBIC AND ANAEROBIC 5CC  Final   Culture  Setup Time   Final    GRAM NEGATIVE RODS ANAEROBIC BOTTLE ONLY CRITICAL RESULT CALLED TO, READ BACK BY AND VERIFIED WITH: T FESTERMAN,RN AT 1414 12/19/14 BY L BENFIELD    Culture   Final    ESCHERICHIA COLI Performed at [facility]    Report Status 12/21/2014 FINAL  Final   Organism ID, Bacteria ESCHERICHIA COLI  Final      Susceptibility   Escherichia coli - MIC*    AMPICILLIN >=32 RESISTANT Resistant     CEFAZOLIN <=4 SENSITIVE Sensitive     CEFEPIME <=1 SENSITIVE Sensitive     CEFTAZIDIME <=1 SENSITIVE Sensitive     CEFTRIAXONE <=1 SENSITIVE Sensitive     CIPROFLOXACIN <=0.25 SENSITIVE Sensitive     GENTAMICIN <=1 SENSITIVE Sensitive     IMIPENEM <=0.25 SENSITIVE Sensitive     TRIMETH/SULFA >=320 RESISTANT Resistant     AMPICILLIN/SULBACTAM 8 SENSITIVE Sensitive     PIP/TAZO <=4 SENSITIVE Sensitive     * ESCHERICHIA COLI  Blood Culture (routine x 2)     Status: None   Collection Time: 12/18/14  6:40 PM  Result Value Ref Range Status   Specimen Description BLOOD LEFT AC  Final   Special Requests BOTTLES DRAWN AEROBIC AND ANAEROBIC 5CC EACH  Final   Culture  Setup Time   Final    GRAM NEGATIVE RODS AEROBIC BOTTLE ONLY CRITICAL RESULT CALLED TO, READ BACK BY AND VERIFIED WITH: T FESTERMAN,RN  AT 1627 12/19/14 BY L BENFIELD    Culture   Final    ESCHERICHIA COLI SUSCEPTIBILITIES PERFORMED ON PREVIOUS CULTURE WITHIN THE LAST 5 DAYS. Performed at [facility]     Report Status 12/21/2014 FINAL  Final  Urine culture     Status: None   Collection Time: 12/18/14  7:20 PM  Result Value Ref Range Status   Specimen Description URINE, CLEAN CATCH  Final   Special Requests NONE  Final   Culture   Final    >=100,000 COLONIES/mL ESCHERICHIA COLI Performed at [facility]    Report Status 12/20/2014 FINAL  Final   Organism ID, Bacteria ESCHERICHIA COLI  Final      Susceptibility   Escherichia coli - MIC*    AMPICILLIN >=32 RESISTANT Resistant     CEFAZOLIN <=4 SENSITIVE Sensitive     CEFTRIAXONE <=1 SENSITIVE Sensitive     CIPROFLOXACIN <=0.25 SENSITIVE Sensitive     GENTAMICIN <=1 SENSITIVE Sensitive     IMIPENEM <=0.25 SENSITIVE Sensitive     NITROFURANTOIN <=16 SENSITIVE Sensitive     TRIMETH/SULFA >=320 RESISTANT Resistant     AMPICILLIN/SULBACTAM 4 SENSITIVE Sensitive     PIP/TAZO <=4 SENSITIVE Sensitive     * >=100,000 COLONIES/mL ESCHERICHIA COLI    Ivin Booty presented on 11/28 for fevers, chills, dysuria. Her Tm was 102.3 in the ED. It was concern for possible early sepsis. Her UA was dirty so she  was to be continue on keflex for the suspected UTI along with her Doxy? Her blood cx came back with E.coli 2/2. It's sens to cefazolin but we can't correlate the breakpoints to PO Keflex. Talked to her today and she stated that she feels better but still has some intermittent low grade fever (once Tm ~100). After discussion with Vernie Shanks, PA and ED physician, we are going to bring her back to reassessment of the bacteremia. If we are going to use PO abx, Cipro may be the best bet since it has correlation with breakpoints for bacteremia. Called the pt and she is going to come back to the ED.    ED Provider: Margarita Mail, PA and ED physician   Onnie Boer, PharmD Infectious Diseases Pharmacist Phone# [phone]

## 2014-12-22 NOTE — Discharge Instructions (Signed)
Return for worsening symptoms, including fever, vomiting and unable to keep down food/fluids, confusion, worsening pain, or any other symptoms concerning to you.  Pyelonephritis, Adult Pyelonephritis is a kidney infection. The kidneys are organs that help clean your blood by moving waste out of your blood and into your pee (urine). This infection can happen quickly, or it can last for a long time. In most cases, it clears up with treatment and does not cause other problems. HOME CARE Medicines  Take over-the-counter and prescription medicines only as told by your doctor.  Take your antibiotic medicine as told by your doctor. Do not stop taking the medicine even if you start to feel better. General Instructions  Drink enough fluid to keep your pee clear or pale yellow.  Avoid caffeine, tea, and carbonated drinks.  Pee (urinate) often. Avoid holding in pee for long periods of time.  Pee before and after sex.  After pooping (having a bowel movement), women should wipe from front to back. Use each tissue only once.  Keep all follow-up visits as told by your doctor. This is important. GET HELP IF:  You do not feel better after 2 days.  Your symptoms get worse.  You have a fever. GET HELP RIGHT AWAY IF:  You cannot take your medicine or drink fluids as told.  You have chills and shaking.  You throw up (vomit).  You have very bad pain in your side (flank) or back.  You feel very weak or you pass out (faint).   This information is not intended to replace advice given to you by your health care provider. Make sure you discuss any questions you have with your health care provider.   Document Released: 02/14/2004 Document Revised: 09/27/2014 Document Reviewed: 05/01/2014 Elsevier Interactive Patient Education [employer].

## 2014-12-22 NOTE — ED Provider Notes (Addendum)
CSN: [id number]     Arrival date & time 12/22/14  1038 History   First MD Initiated Contact with Patient 12/22/14 1103     No chief complaint on file.    (Consider location/radiation/quality/duration/timing/severity/associated sxs/prior Treatment) HPI 69 year old female who presents for reevaluation. Has a history of CAD status post stent, hypertension, and CKD. I was recently seen in the emergency department 3 days ago for fever, chills, myalgias, dysuria, and back pain. Presentation was concerning for sepsis at that time, and she had evidence of pyelonephritis. Was given IV antibiotics, and per patient request she was discharged home. Blood cultures were drawn at that time and grew positive for Escherichia coli. Urine culture also positive for Escherichia coli. Patient was updated on her blood work today, and sent to the ED for reevaluation. She states that she has an additional follow-up appointment with her primary care provider in 2 days. Has been taking Keflex as prescribed. States that overall she has been feeling better. Has not had recurrence of fevers or chills. Does occasionally feel nauseous, but has no vomiting. Urinary symptoms have resolved, and her flank pain had fully resolved as well. Feels slightly weak, but says her overall symptoms have significantly improved over the course of the past few days.   Past Medical History  Diagnosis Date  . MI (myocardial infarction) ([place])   . Hypertension   . Thyroid disease   . GERD (gastroesophageal reflux disease)   . Peptic ulcer   . Coronary artery disease   . CKD (chronic kidney disease) stage 2, GFR 60-89 ml/min   . Edentulous   . Heart murmur   . Microalbuminuria    Past Surgical History  Procedure Laterality Date  . Coronary angioplasty with stent placement    . Appendectomy    . Tonsillectomy    . Tubal ligation     No family history on file. Social History  Substance Use Topics  . Smoking status: Former Research scientist (life sciences)  .  Smokeless tobacco: None  . Alcohol Use: Yes     Comment: rare   OB History    No data available     Review of Systems 10/14 systems reviewed and are negative other than those stated in the HPI   Allergies  Asa; Iodine; and Penicillins  Home Medications   Prior to Admission medications   Medication Sig Start Date End Date Taking? Authorizing Provider  amLODipine (NORVASC) 5 MG tablet Take 5 mg by mouth daily.   Yes Historical Provider, MD  cephALEXin (KEFLEX) 500 MG capsule Take 1 capsule (500 mg total) by mouth 4 (four) times daily. 12/18/14  Yes Fredia Sorrow, MD  colesevelam (WELCHOL) 625 MG tablet Take 625 mg by mouth 2 (two) times daily with a meal.   Yes Historical Provider, MD  diphenhydrAMINE (SOMINEX) 25 MG tablet Take 25 mg by mouth at bedtime as needed for sleep.   Yes Historical Provider, MD  levothyroxine (SYNTHROID, LEVOTHROID) 25 MCG tablet Take 25 mcg by mouth daily before breakfast.   Yes Historical Provider, MD  losartan-hydrochlorothiazide (HYZAAR) 100-25 MG tablet Take 1 tablet by mouth daily.   Yes Historical Provider, MD  metoprolol succinate (TOPROL-XL) 50 MG 24 hr tablet Take 50 mg by mouth daily. Take with or immediately following a meal.   Yes Historical Provider, MD  nitroGLYCERIN (NITROSTAT) 0.4 MG SL tablet Place 0.4 mg under the tongue every 5 (five) minutes as needed for chest pain.   Yes Historical Provider, MD  omeprazole (PRILOSEC) 20  MG capsule Take 20 mg by mouth daily.   Yes Historical Provider, MD  ondansetron (ZOFRAN ODT) 4 MG disintegrating tablet Take 1 tablet (4 mg total) by mouth every 8 (eight) hours as needed for nausea or vomiting. 12/18/14  Yes Fredia Sorrow, MD   BP 173/81 mmHg  Pulse 72  Temp(Src) 98.2 F (36.8 C) (Oral)  Resp 20  Ht 5\' 7"  (1.702 m)  Wt 210 lb (95.255 kg)  BMI 32.88 kg/m2  SpO2 99% Physical Exam Physical Exam  Nursing note and vitals reviewed. Constitutional: Well developed, well nourished, non-toxic, and  in no acute distress Head: Normocephalic and atraumatic.  Mouth/Throat: Oropharynx is clear and moist.  Neck: Normal range of motion. Neck supple.  Cardiovascular: Normal rate and regular rhythm.   Pulmonary/Chest: Effort normal and breath sounds normal.  Abdominal: Soft. There is no tenderness. There is no rebound and no guarding. No CVA tenderness.  Musculoskeletal: Normal range of motion.  Neurological: Alert, no facial droop, fluent speech, moves all extremities symmetrically Skin: Skin is warm and dry.  Psychiatric: Cooperative   ED Course  Procedures (including critical care time) Labs Review Labs Reviewed  CBC WITH DIFFERENTIAL/PLATELET - Abnormal; Notable for the following:    Hemoglobin 11.7 (*)    HCT 35.4 (*)    All other components within normal limits  COMPREHENSIVE METABOLIC PANEL - Abnormal; Notable for the following:    Albumin 3.0 (*)    All other components within normal limits  URINALYSIS, ROUTINE W REFLEX MICROSCOPIC (NOT AT [facility]) - Abnormal; Notable for the following:    Hgb urine dipstick TRACE (*)    Protein, ur 100 (*)    All other components within normal limits  URINE MICROSCOPIC-ADD ON - Abnormal; Notable for the following:    Squamous Epithelial / LPF 0-5 (*)    Bacteria, UA RARE (*)    All other components within normal limits  I-STAT CG4 LACTIC ACID, ED    Imaging Review No results found. I have personally reviewed and evaluated these images and lab results as part of my medical decision-making.   EKG Interpretation None      MDM   Final diagnoses:  Follow-up examination  Pyelonephritis    In short, this is a 69 year old female who presents with reevaluation after recent blood cultures and urine cultures grew positive for Escherichia coli. She is well-appearing and in no acute distress. Her vital signs are not concerning here in the emergency department with persistent sepsis. Her blood work including CBC, comp metabolic profile, and  lactic acid not concerning. Her urinalysis shows resolving infection. She has a soft and benign abdomen and no evidence of persistent CVA tenderness. She appears well-hydrated, and the remainder of her exam is unremarkable. Clinically she does appear improved from current antibiotics and doses of IV antibiotics several days prior. At this time I do not feel that she requires admission for further IV antibiotics as she is recovering well from her illness. She will have 2 day follow-up with her primary care provider and I given her strict return instructions and warning signs as well. She expressed understanding of all discharge instructions and felt comfortable with the plan of care.  Patient was called on the phone at 4:30PM 12/22/14 after discussion with the pharmacist that her bacteremia is better covered with ciprofloxacin rather than keflex. Plan to call in course of ciprofloxacin to patient. Patient expressed understanding of medication changes. Again, reviewed strict return instructions if any worsening symptoms, such as  fever, chills, fatigue, N/V, pain, etc.  Forde Dandy, MD 12/22/14 Rodessa Steffani Dionisio, MD 12/24/14 [phone]

## 2015-03-29 DIAGNOSIS — C50911 Malignant neoplasm of unspecified site of right female breast: Secondary | ICD-10-CM | POA: Insufficient documentation

## 2016-09-18 IMAGING — CT CT CHEST W/O CM
1 series · 15 of 31 positions shown, 19 images · non-contrast
Comparison: Chest radiograph performed earlier today at [DATE] p.m.

CLINICAL DATA: Acute onset of cough, fever and lower back pain.
Initial encounter.

EXAM:
CT CHEST WITHOUT CONTRAST
TECHNIQUE: Multidetector CT imaging of the chest was performed following the
standard protocol without IV contrast.

[Series 2: chest 5.0 b31f · axial · 0.66mm/px · z∈[+1134,+1384]mm · 15 of 56 slices shown, 19 images]
[im 3/56  mediastinal]
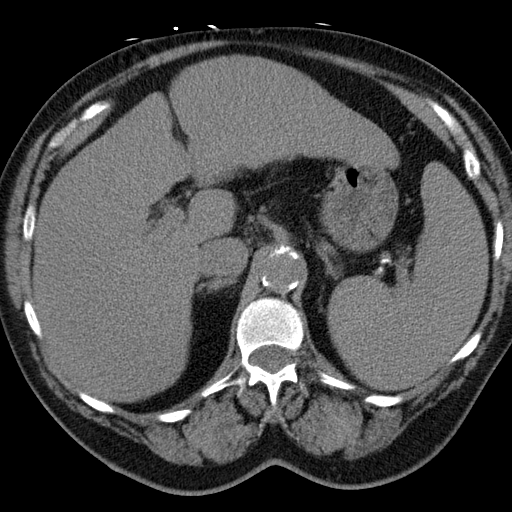
[im 3/56  lung]
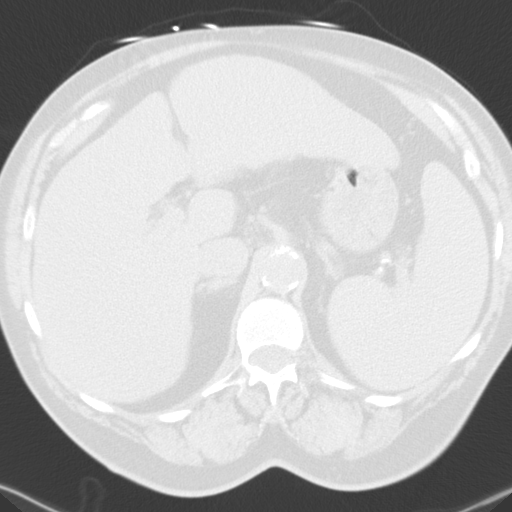
[im 7/56  lung]
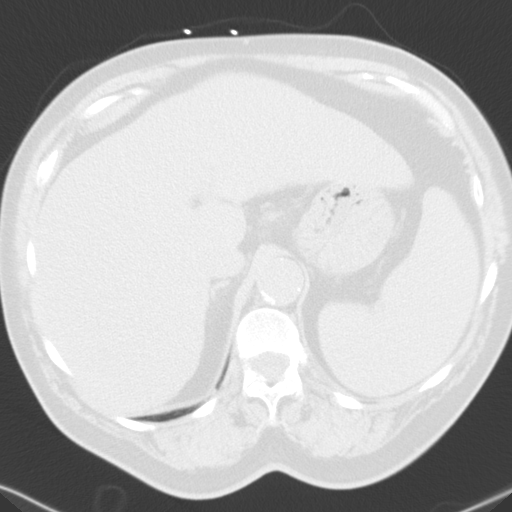
[im 11/56  lung]
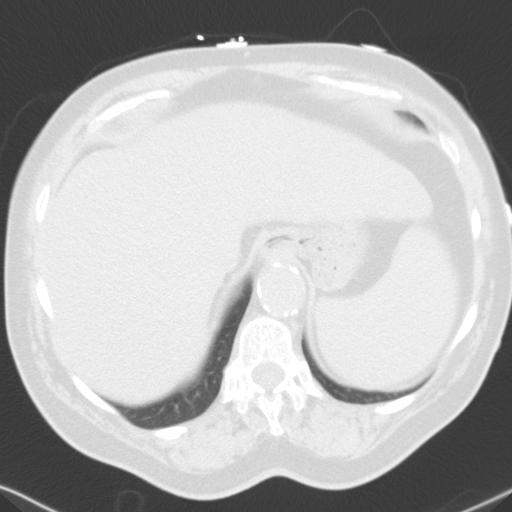
[im 13/56  lung]
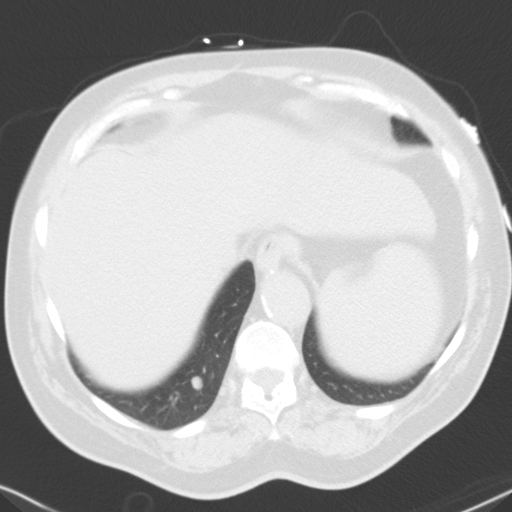
[im 17/56  mediastinal]
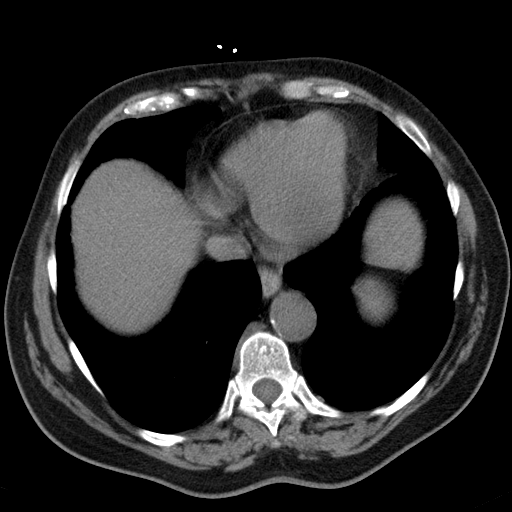
[im 17/56  lung]
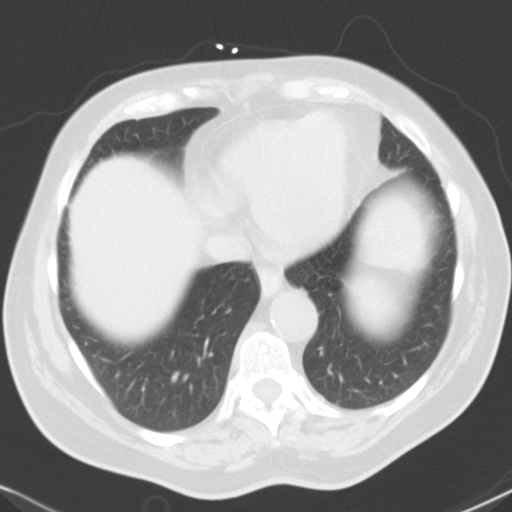
[im 21/56  lung]
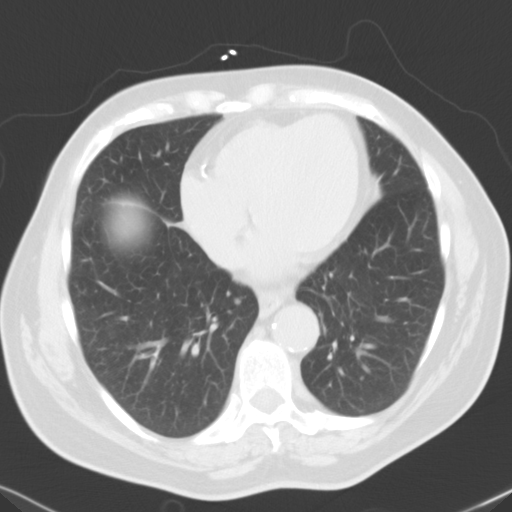
[im 25/56  lung]
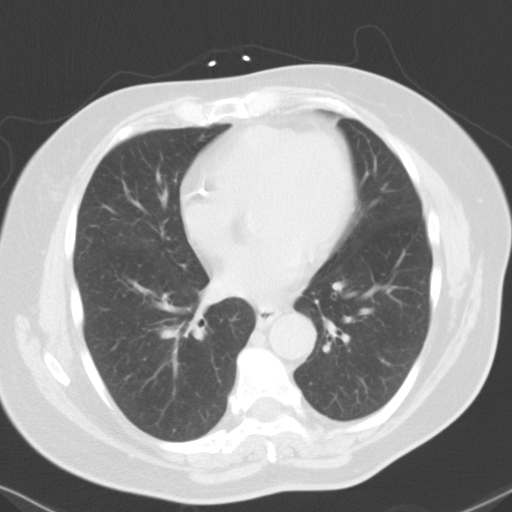
[im 29/56  lung]
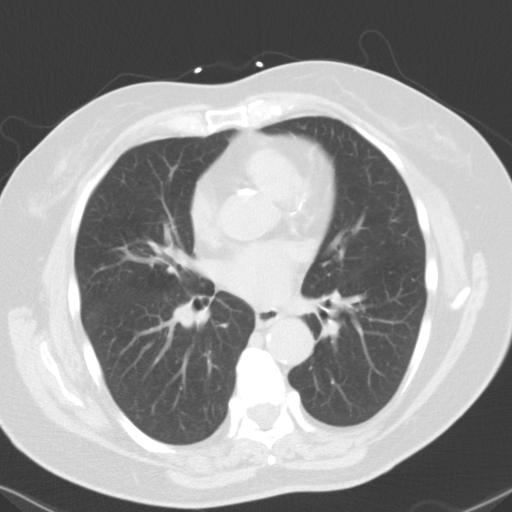
[im 31/56  mediastinal]
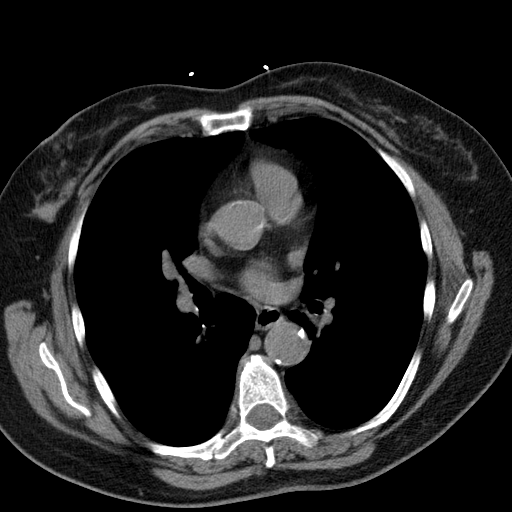
[im 31/56  lung]
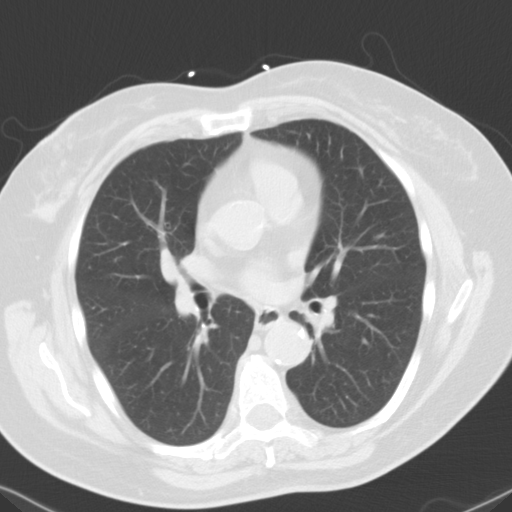
[im 34/56  lung]
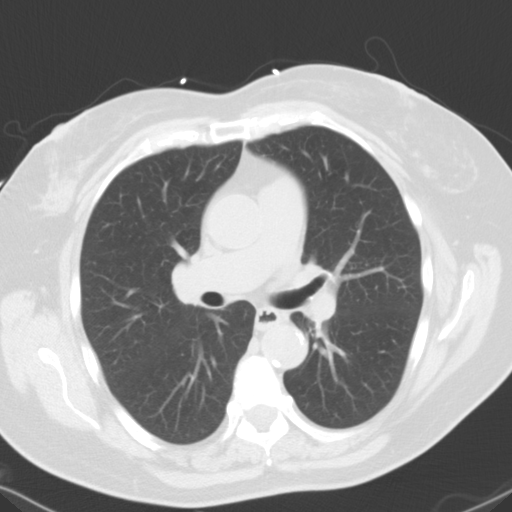
[im 37/56  lung]
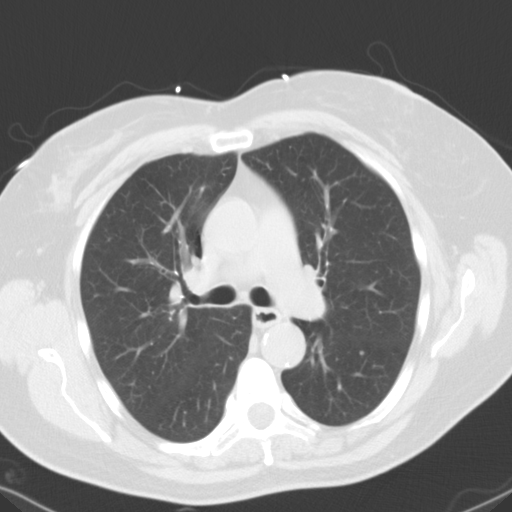
[im 41/56  lung]
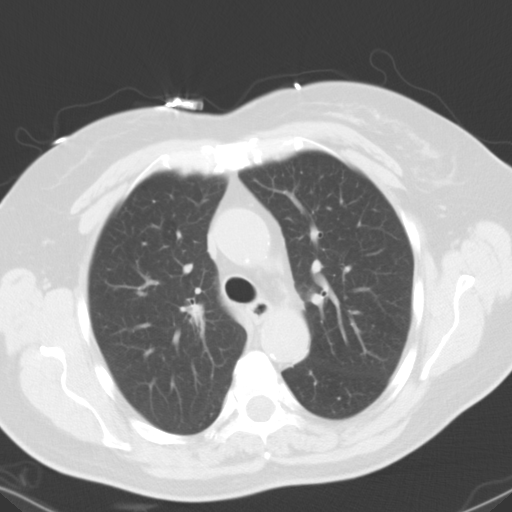
[im 45/56  mediastinal]
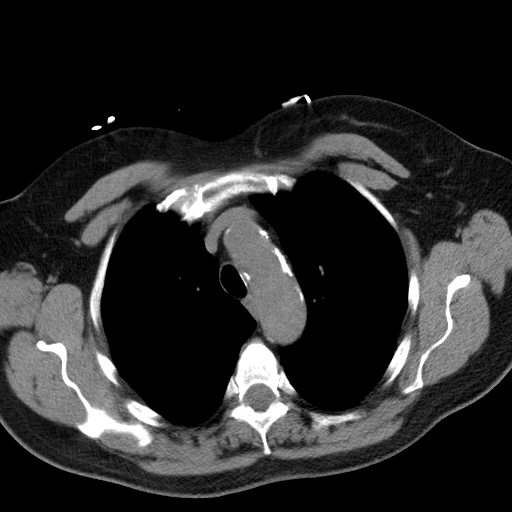
[im 45/56  lung]
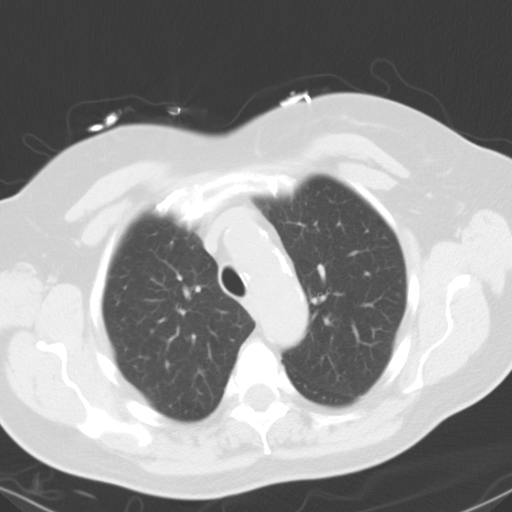
[im 49/56  lung]
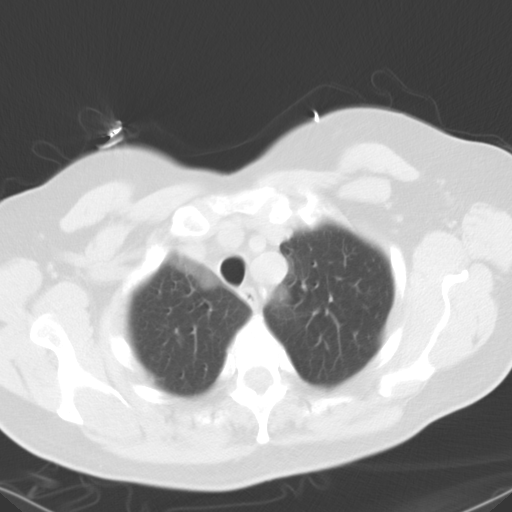
[im 53/56  lung]
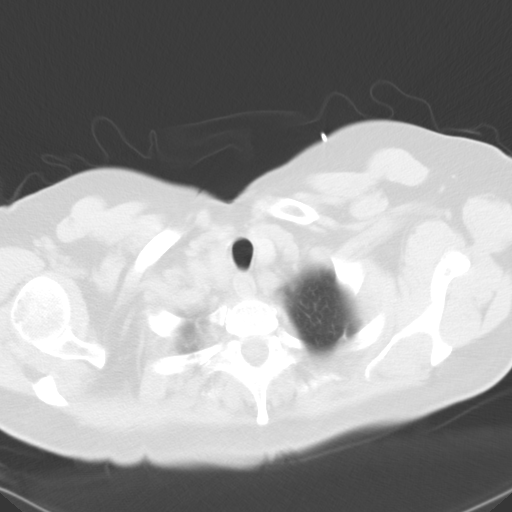

[15 of 31 positions shown; findings below may reference images not displayed]

FINDINGS: Two 8 mm nodules are noted within the right lower lobe (images 38
and 44 of 56). Additional scattered tiny nodules are noted
throughout both lungs. This most likely reflects sequelae of remote
infection, though metastatic disease could conceivably have a
similar appearance.

The lungs are otherwise clear. No focal consolidation, pleural
effusion or pneumothorax is seen.

Diffuse coronary artery calcifications are noted. The mediastinum is
otherwise unremarkable. No mediastinal lymphadenopathy is seen. No
pericardial effusion is identified. Scattered calcification is noted
along the thoracic aorta. The great vessels are grossly
unremarkable. The visualized portions of thyroid gland are
unremarkable. No axillary lymphadenopathy is seen.

The spleen is enlarged, measuring 15.3 cm in length. The visualized
portions of the liver are grossly unremarkable.

No acute osseous abnormalities are seen.
IMPRESSION: 1. Scattered tiny nodules throughout both lungs, including two 8 mm
nodules at the right lower lobe. This most likely reflects sequelae
of remote infection, though metastatic disease could conceivably
have a similar appearance. PET/CT could be considered for further
evaluation, on an elective nonemergent basis.
2. Lungs otherwise clear.
3. Diffuse coronary artery calcifications seen.
4. Splenomegaly noted.

## 2017-09-09 ENCOUNTER — Other Ambulatory Visit: Payer: Self-pay

## 2017-09-09 ENCOUNTER — Encounter: Payer: Self-pay | Admitting: Neurology

## 2017-09-09 ENCOUNTER — Ambulatory Visit: Payer: Medicare HMO | Admitting: Neurology

## 2017-09-09 ENCOUNTER — Encounter

## 2017-09-09 VITALS — BP 110/63 | HR 64 | Resp 20 | Ht 67.0 in | Wt 189.0 lb

## 2017-09-09 DIAGNOSIS — G629 Polyneuropathy, unspecified: Secondary | ICD-10-CM | POA: Diagnosis not present

## 2017-09-09 DIAGNOSIS — G620 Drug-induced polyneuropathy: Secondary | ICD-10-CM

## 2017-09-09 DIAGNOSIS — T451X5A Adverse effect of antineoplastic and immunosuppressive drugs, initial encounter: Secondary | ICD-10-CM

## 2017-09-09 DIAGNOSIS — F329 Major depressive disorder, single episode, unspecified: Secondary | ICD-10-CM

## 2017-09-09 DIAGNOSIS — F32A Depression, unspecified: Secondary | ICD-10-CM

## 2017-09-09 DIAGNOSIS — R208 Other disturbances of skin sensation: Secondary | ICD-10-CM

## 2017-09-09 MED ORDER — LAMOTRIGINE 100 MG PO TABS
100.0000 mg | ORAL_TABLET | Freq: Two times a day (BID) | ORAL | 11 refills | Status: AC
Start: 2017-09-09 — End: ?

## 2017-09-09 NOTE — Patient Instructions (Signed)
The pharmacy has the prescription for lamotrigine 100 mg tablets. For 5 days, just take one half pill a day. For the next 5 days, take one half pill twice a day. For the next 5 days, take one half pill 3 times a day Then start taking one pill twice a day from this point on.    In the future, we may increase the dose further.  If you get a rash, need to stop the medication and not take it again. 

## 2017-09-09 NOTE — Progress Notes (Signed)
[facility]  PATIENT: Rhonda Burnett DOB: May 07, 1945  REFERRING DOCTOR OR PCP:  Dr. Daryel Gerald SOURCE: patient, notes from Drs. Gallemore and Chinnasami, lab reports, imaging reports  _________________________________   HISTORICAL  CHIEF COMPLAINT:  Chief Complaint  Patient presents with  . Peripheral Neuropathy    Rhonda Burnett is here with her husband Rhonda Burnett for eval of pain in hands and feet following chemo tx.'s for breast cancer..  On Gabaepentin '600mg'$  tid but not sure how much it helps./fim    HISTORY OF PRESENT ILLNESS:  I had the pleasure seeing your patient, Rhonda Burnett, at [facility] for neurologic consultation regarding her numbness in the hands and feet that started after chemotherapy for breast cancer.  In 2017, she was diagnosed with right breast cancer.   She had 2 tumors.     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   The diagnosis was first Her-2 positive in one and Her-2 negative I the other according to her and her husband.   That prompted treatment with mastectomy, chemotherapy and radiation.   She was treated with [facility] chemotherapy (Taxotere, Carboplatin and Herceptin) for about 6 month in 2017 followed by radiation in 2017.  She is currently on tamoxifen.    At some point the diagnosis was changed to Her-2 negative for both of the pathologic samples.     She reports that she noted mild numbness in her hands and feet with shooting pain towards the end of the 6 months of Optima chemotherapy.    She had numbness greater than pain initially.   She was able to tolerate the symptoms well in 2018 but in early 2019, she had the pain worsened.   More recently, she began to have falls. She feels she has continued to worsen progressively with more.    She has had 4-5 falls.  Currently, she has numbness in her feet up to the ankle and numbness in all of her fingers.    She cut her middle finger on the left deep and didn't even  feel the cut.     Pain is in the same distribution.  Quality of pain is burning with an aching quality.     Pain is 24/7 but worsens randomly but more consistently with standing or walking.   Her gait is off balance.  She now needs to grab a hold when she is not on a flat even surface.  She looks down as she walks.  She used to be very active with dancing and is unable to do those activities.  She notes no weakness.  Bladder function is unchanged.  Gabapentin 600 mg po tid was started but she does not feel it has helped much.    She sleeps for about 4 hours and then wakes up a couple times the second half of the night.   She is tired and sleepy during the day and takes naps daily (1 or 2).   She has had some high sugars but has not been diagnosed with DM and last two HgbA1c were 5.3 and 5.4.  X-ray of the lumbar spine showed lower lumbar degenerative changes and a small calcified abdominal aortic aneurysm.  X-ray of the cervical spine was normal.  REVIEW OF SYSTEMS: Constitutional: No fevers, chills, sweats, or change in appetite.  She has fatigue. Eyes: No visual changes, double vision, eye pain Ear, nose and throat: No hearing loss, ear pain, nasal congestion, sore throat Cardiovascular: No chest pain, palpitations  Respiratory: No shortness of breath at rest or with exertion.   No wheezes GastrointestinaI: No nausea, vomiting, diarrhea, abdominal pain, fecal incontinence Genitourinary: No dysuria, urinary retention or frequency.  No nocturia. Musculoskeletal: No neck pain, back pain Integumentary: No rash, pruritus, skin lesions Neurological: as above Psychiatric: She notes depression and anxiety. Endocrine: No palpitations, diaphoresis, change in appetite, change in weigh or increased thirst Hematologic/Lymphatic: No anemia, purpura, petechiae. Allergic/Immunologic: No itchy/runny eyes, nasal congestion, recent allergic reactions, rashes  ALLERGIES: Allergies  Allergen Reactions    . Aspirin Other (See Comments)    Ulcer Abdominal pain/ulcers Abdominal pain   . Penicillins Rash, Shortness Of Breath and Swelling    Childhood reaction   . Amlodipine Other (See Comments) and Swelling    Peripheral edema Peripheral edema   . Morphine Nausea And Vomiting  . Iodine   . Metreleptin Other (See Comments)    unknown unknown   . Montelukast     Hallucinations  . Soap     Rash, itching. Can only use white Dove soap.  . Doxycycline Itching  . Lisinopril Cough and Other (See Comments)    cough     HOME MEDICATIONS:  Current Outpatient Medications:  .  acetaminophen (TYLENOL) 500 MG tablet, Take by mouth., Disp: , Rfl:  .  ALPHA LIPOIC ACID PO, Take by mouth., Disp: , Rfl:  .  DULoxetine (CYMBALTA) 60 MG capsule, , Disp: , Rfl:  .  ezetimibe (ZETIA) 10 MG tablet, , Disp: , Rfl:  .  Flaxseed, Linseed, (FLAXSEED OIL PO), Take by mouth., Disp: , Rfl:  .  gabapentin (NEURONTIN) 600 MG tablet, 600 mg 3 (three) times daily. , Disp: , Rfl:  .  hydrALAZINE (APRESOLINE) 25 MG tablet, TAKE ONE (1) TABLET BY MOUTH TWO (2) TIMES DAILY, Disp: , Rfl:  .  levothyroxine (SYNTHROID, LEVOTHROID) 25 MCG tablet, Take 25 mcg by mouth daily before breakfast., Disp: , Rfl:  .  losartan-hydrochlorothiazide (HYZAAR) 100-25 MG tablet, Take 1 tablet by mouth daily., Disp: , Rfl:  .  magnesium oxide (MAG-OX) 400 MG tablet, Take by mouth., Disp: , Rfl:  .  metoprolol succinate (TOPROL-XL) 50 MG 24 hr tablet, Take 50 mg by mouth daily. Take with or immediately following a meal., Disp: , Rfl:  .  nitroGLYCERIN (NITROSTAT) 0.4 MG SL tablet, Place 0.4 mg under the tongue every 5 (five) minutes as needed for chest pain., Disp: , Rfl:  .  omeprazole (PRILOSEC) 20 MG capsule, Take 20 mg by mouth daily., Disp: , Rfl:  .  tamoxifen (NOLVADEX) 20 MG tablet, , Disp: , Rfl:  .  tiZANidine (ZANAFLEX) 4 MG tablet, , Disp: , Rfl:   PAST MEDICAL HISTORY: Past Medical History:  Diagnosis Date  . CKD  (chronic kidney disease) stage 2, GFR 60-89 ml/min   . Coronary artery disease   . Edentulous   . GERD (gastroesophageal reflux disease)   . Heart murmur   . Hypertension   . MI (myocardial infarction) ([place])   . Microalbuminuria   . Peptic ulcer   . Thyroid disease     PAST SURGICAL HISTORY: Past Surgical History:  Procedure Laterality Date  . APPENDECTOMY    . CORONARY ANGIOPLASTY WITH STENT PLACEMENT    . TONSILLECTOMY    . TUBAL LIGATION      FAMILY HISTORY: Family History  Problem Relation Age of Onset  . Diabetes type II Mother   . Hypertension Mother   . Stroke Mother   . Arthritis  Mother   . Diabetes type II Father   . Heart disease Father   . Stroke Sister   . Diabetes type II Sister   . Other Sister 8       Polio   . Hypertension Sister     SOCIAL HISTORY:  Social History   Socioeconomic History  . Marital status: Single    Spouse name: Not on file  . Number of children: Not on file  . Years of education: Not on file  . Highest education level: Not on file  Occupational History  . Not on file  Social Needs  . Financial resource strain: Not on file  . Food insecurity:    Worry: Not on file    Inability: Not on file  . Transportation needs:    Medical: Not on file    Non-medical: Not on file  Tobacco Use  . Smoking status: Former Research scientist (life sciences)  . Smokeless tobacco: Never Used  Substance and Sexual Activity  . Alcohol use: Yes    Comment: rare  . Drug use: No  . Sexual activity: Not on file  Lifestyle  . Physical activity:    Days per week: Not on file    Minutes per session: Not on file  . Stress: Not on file  Relationships  . Social connections:    Talks on phone: Not on file    Gets together: Not on file    Attends religious service: Not on file    Active member of club or organization: Not on file    Attends meetings of clubs or organizations: Not on file    Relationship status: Not on file  . Intimate partner violence:    Fear of  current or ex partner: Not on file    Emotionally abused: Not on file    Physically abused: Not on file    Forced sexual activity: Not on file  Other Topics Concern  . Not on file  Social History Narrative  . Not on file     PHYSICAL EXAM  Vitals:   09/09/17 1031  BP: 110/63  Pulse: 64  Resp: 20  Weight: 189 lb (85.7 kg)  Height: '5\' 7"'$  (1.702 m)    Body mass index is 29.6 kg/m.   General: The patient is well-developed and well-nourished and in no acute distress   Neck: The neck is supple, no carotid bruits are noted.  The neck is nontender with good range of motion  Cardiovascular: The heart has a regular rate and rhythm with a normal S1 and S2. There were no murmurs, gallops or rubs. Lungs are clear to auscultation.  Skin: Extremities are without significant edema.  Musculoskeletal:  Back is nontender  Neurologic Exam  Mental status: The patient is alert and oriented x 3 at the time of the examination. The patient has apparent normal recent and remote memory, with an apparently normal attention span and concentration ability.   Speech is normal.  Cranial nerves: Extraocular movements are full.    Facial symmetry is present. There is good facial sensation to soft touch bilaterally.Facial strength is normal.  Trapezius and sternocleidomastoid strength is normal. No dysarthria is noted.  The tongue is midline, and the patient has symmetric elevation of the soft palate. No obvious hearing deficits are noted.  Motor:  Muscle bulk is normal.   Tone is normal. Strength is  5 / 5 in all 4 extremities including the toes  Sensory: In the arms, she has about 50%  reduction of vibration sensation in the distal fingers and just minimal loss of vibration sensation in the proximal fingers.  There is a gradient of reduce touch and pinprick sensation normal above the wrist.  In the legs, she has no sensation to vibration at the toes, 10% sensation at the ankles and 75% sensation at the  knees.  She has no sensation to temperature or touch in the toes and begins to feel sensation by the ankles and returns to normal just below the knees.   Coordination: Cerebellar testing reveals good finger-nose-finger and reduced heel-to-shin bilaterally.  Gait and station: Station is normal.  She looks down when she walks.  The stride is mildly reduced in the stance is wide.  She cannot tandem walk.  She is Romberg positive.  Reflexes: Deep tendon reflexes are 1 at the biceps and absent elsewhere in the arms.  There are absent in the legs..   Plantar responses are flexor.    DIAGNOSTIC DATA (LABS, IMAGING, TESTING) - I reviewed patient records, labs, notes, testing and imaging myself where available.  Lab Results  Component Value Date   WBC 5.2 12/22/2014   HGB 11.7 (L) 12/22/2014   HCT 35.4 (L) 12/22/2014   MCV 81.2 12/22/2014   PLT 151 12/22/2014      Component Value Date/Time   NA 136 12/22/2014 1110   K 4.0 12/22/2014 1110   CL 106 12/22/2014 1110   CO2 24 12/22/2014 1110   GLUCOSE 85 12/22/2014 1110   BUN 11 12/22/2014 1110   CREATININE 0.72 12/22/2014 1110   CALCIUM 9.0 12/22/2014 1110   PROT 7.3 12/22/2014 1110   ALBUMIN 3.0 (L) 12/22/2014 1110   AST 32 12/22/2014 1110   ALT 24 12/22/2014 1110   ALKPHOS 79 12/22/2014 1110   BILITOT 0.6 12/22/2014 1110   GFRNONAA >60 12/22/2014 1110   GFRAA >60 12/22/2014 1110       ASSESSMENT AND PLAN  Polyneuropathy - Plan: Vitamin B12, Sedimentation rate, NCV with EMG(electromyography), Multiple Myeloma Panel (SPEP&IFE w/QIG), Cryoglobulin  Polyneuropathy following chemotherapy (HCC)  Dysesthesia  Depression, unspecified depression type   In summary, Mrs. Vega is a 72 year old woman with a sensory polyneuropathy that started shortly after her chemotherapy with carboplatin, Herceptin  and Taxotere for breast cancer.  Currently, she has numbness, severe dysesthesia and proprioception and balance.    To help with the  dysesthetic quality of the neuropathy, I will start lamotrigine and titrated up to 100 mg p.o. twice daily.  This dose can be titrated further if needed.  If the rash develops she needs to stop.  She does not appear to be getting much benefit from gabapentin and duloxetine.  Based on results we may consider stopping the gabapentin and switching to oxcarbazepine.  I will check some blood work to make sure we are not overlooking a treatable cause of her polyneuropathy.  We will also check a nerve conduction and EMG study and I will see her that day.  I will reassess her at that time to determine if further changes in medications are needed.  She should call us sooner if she has new or worsening symptoms.  Thank you for asking me to see Mrs. Lonardo.  Please let me know if I can be of further assistance with her or other patients in the future.   Iline Buchinger A. Felecia Shelling, MD, [facility] 6/96/2952, 84:13 AM Certified in Neurology, Clinical Neurophysiology, Sleep Medicine, Pain Medicine and Neuroimaging  [facility] [address], [place]  [place], [place] [address] [phone]

## 2017-09-14 LAB — MULTIPLE MYELOMA PANEL, SERUM
ALPHA 1: 0.3 g/dL (ref 0.0–0.4)
Albumin SerPl Elph-Mcnc: 3.3 g/dL (ref 2.9–4.4)
Albumin/Glob SerPl: 1.2 (ref 0.7–1.7)
Alpha2 Glob SerPl Elph-Mcnc: 0.7 g/dL (ref 0.4–1.0)
B-Globulin SerPl Elph-Mcnc: 1 g/dL (ref 0.7–1.3)
Gamma Glob SerPl Elph-Mcnc: 0.9 g/dL (ref 0.4–1.8)
Globulin, Total: 2.9 g/dL (ref 2.2–3.9)
IGA/IMMUNOGLOBULIN A, SERUM: 289 mg/dL (ref 64–422)
IGG (IMMUNOGLOBIN G), SERUM: 905 mg/dL (ref 700–1600)
IGM (IMMUNOGLOBULIN M), SRM: 101 mg/dL (ref 26–217)
TOTAL PROTEIN: 6.2 g/dL (ref 6.0–8.5)

## 2017-09-14 LAB — CRYOGLOBULIN

## 2017-09-14 LAB — VITAMIN B12: VITAMIN B 12: 678 pg/mL (ref 232–1245)

## 2017-09-14 LAB — SEDIMENTATION RATE: Sed Rate: 15 mm/hr (ref 0–40)

## 2017-09-16 ENCOUNTER — Telehealth: Payer: Self-pay | Admitting: *Deleted

## 2017-09-16 NOTE — Telephone Encounter (Signed)
-----   Message from Britt Bottom, MD sent at 09/15/2017  8:30 PM EDT ----- Please let the patient know that the lab work is fine.  I will see her at the NCV/EMG

## 2017-09-16 NOTE — Telephone Encounter (Signed)
Spoke with Ivin Booty and reviewed below lab results. She verbalized understanding of same/fim

## 2017-10-20 ENCOUNTER — Ambulatory Visit (INDEPENDENT_AMBULATORY_CARE_PROVIDER_SITE_OTHER): Payer: Medicare HMO | Admitting: Neurology

## 2017-10-20 ENCOUNTER — Ambulatory Visit: Payer: Medicare HMO | Admitting: Neurology

## 2017-10-20 DIAGNOSIS — G629 Polyneuropathy, unspecified: Secondary | ICD-10-CM | POA: Diagnosis not present

## 2017-10-20 DIAGNOSIS — Z0289 Encounter for other administrative examinations: Secondary | ICD-10-CM

## 2017-10-20 MED ORDER — GABAPENTIN 600 MG PO TABS: 600.0000 mg | ORAL_TABLET | Freq: Three times a day (TID) | ORAL | 11 refills | Status: DC

## 2017-10-20 NOTE — Progress Notes (Signed)
Full Name: Rhonda Burnett Gender: Female MRN #: [id number] Date of Birth: [birth year]    Visit Date: 10/20/2017 09:30 Age: 72 Years 59 Months Old Examining Physician: Arlice Colt, MD  Referring Physician: Felecia Shelling, MD    History:  Rhonda Burnett is a 72 year old woman with numbness and pain the feet and hands.  She has a positive Romberg sign.  She has been on chemotherapy for breast cancer including carboplatin and Taxotere.     Nerve conduction studies: The right median, ulnar, peroneal and tibial motor responses had reduced amplitude with normal distal latencies and normal conduction velocities.  The median, ulnar, radial, superficial peroneal and sural sensory responses had reduced amplitude with normal peak latencies.  The F-wave responses were normal.  Needle EMG: Needle EMG of selected muscles of the right leg was performed.  She had mild chronic denervation In the vastus medialis, tibialis anterior, peroneus longus, gastrocnemius and abductor hallucis.  The iliopsoas muscle was normal.  The gluteus medius had a few polyphasic units but recruitment was normal.  Impression: This EMG/NCV study is consistent with a length dependent axonal sensorimotor polyneuropathy.  A minimal superimposed chronic lumbosacral radiculopathy cannot be ruled out.  Richard A. Felecia Shelling, MD, PhD, FAAN Certified in Neurology, Clinical Neurophysiology, Sleep Medicine, Pain Medicine and Neuroimaging Director, [place] [facility] [address], [place], [place] [address] [phone]        [facility]    Nerve / Sites Muscle Latency Ref. Amplitude Ref. Rel Amp Segments Distance Velocity Ref. Area    ms ms mV mV %  cm m/s m/s mVms  R Median - APB     Wrist APB 3.1 ?4.4 3.6 ?4.0 100 Wrist - APB 7   10.8     Upper arm APB 7.2  3.2  87.9 Upper arm - Wrist 21 52 ?49 10.6  R Ulnar - ADM     Wrist ADM 2.4 ?3.3 5.9 ?6.0 100 Wrist -  ADM 7   19.6     B.Elbow ADM 6.3  5.4  90.9 B.Elbow - Wrist 20 51 ?49 18.4     A.Elbow ADM 8.3  5.4  99.7 A.Elbow - B.Elbow 10 51 ?49 17.9         A.Elbow - Wrist      R Peroneal - EDB     Ankle EDB 4.6 ?6.5 0.9 ?2.0 100 Ankle - EDB 9   3.4     Fib head EDB 12.6  0.7  75.2 Fib head - Ankle 36 45 ?44 2.9     Pop fossa EDB 15.3  0.5  66.4 Pop fossa - Fib head 12 44 ?44 1.6         Pop fossa - Ankle      R Tibial - AH     Ankle AH 4.9 ?5.8 2.9 ?4.0 100 Ankle - AH 9   7.6     Pop fossa AH 14.2  2.4  81.5 Pop fossa - Ankle 38 41 ?41 5.7             SNC    Nerve / Sites Rec. Site Peak Lat Ref.  Amp Ref. Segments Distance    ms ms V V  cm  R Radial - Anatomical snuff box (Forearm)     Forearm Wrist 2.4 ?2.9 10 ?15 Forearm - Wrist 10  R Sural - Ankle (Calf)     Calf Ankle  4.0 ?4.4 3 ?6 Calf - Ankle 14  R Superficial peroneal - Ankle (Calf)     Calf Ankle 3.9 ?4.4 3 ?6 Calf - Ankle 14  R Median - Orthodromic (Dig II, Mid palm)     Dig II Wrist 2.9 ?3.4 8 ?10 Dig II - Wrist 13  R Ulnar - Orthodromic, (Dig V, Mid palm)     Dig V Wrist 2.6 ?3.1 3 ?5 Dig V - Wrist 54               F  Wave    Nerve F Lat Ref.   ms ms  R Tibial - AH 54.8 ?56.0  R Ulnar - ADM 28.0 ?32.0          EMG full       EMG Summary Table    Spontaneous MUAP Recruitment  Muscle IA Fib PSW Fasc Other Amp Dur. Poly Pattern  R. Vastus medialis Normal None None None _______ Increased Increased 2+ Reduced  R. Tibialis anterior Normal None None None _______ Increased Increased 1+ Reduced  R. Peroneus longus Normal None None None _______ Normal Normal 1+ Normal  R. Gastrocnemius (Medial head) Normal None None None _______ Increased Increased 1+ Reduced  R. Abductor hallucis Normal None None None _______ Normal Normal 2+ Reduced  R. Iliopsoas Normal None None None _______ Normal Normal Normal Normal  R. Gluteus medius Normal None None None _______ Normal Normal 1+ Normal

## 2018-03-09 ENCOUNTER — Ambulatory Visit: Payer: Medicare HMO | Admitting: Neurology

## 2018-03-09 ENCOUNTER — Other Ambulatory Visit: Payer: Self-pay

## 2018-03-09 ENCOUNTER — Encounter: Payer: Self-pay | Admitting: Neurology

## 2018-03-09 VITALS — BP 135/65 | HR 60 | Ht 66.0 in | Wt 196.0 lb

## 2018-03-09 DIAGNOSIS — F32A Depression, unspecified: Secondary | ICD-10-CM

## 2018-03-09 DIAGNOSIS — T451X5A Adverse effect of antineoplastic and immunosuppressive drugs, initial encounter: Principal | ICD-10-CM

## 2018-03-09 DIAGNOSIS — F329 Major depressive disorder, single episode, unspecified: Secondary | ICD-10-CM | POA: Diagnosis not present

## 2018-03-09 DIAGNOSIS — G620 Drug-induced polyneuropathy: Secondary | ICD-10-CM | POA: Diagnosis not present

## 2018-03-09 DIAGNOSIS — C50911 Malignant neoplasm of unspecified site of right female breast: Secondary | ICD-10-CM | POA: Diagnosis not present

## 2018-03-09 DIAGNOSIS — R208 Other disturbances of skin sensation: Secondary | ICD-10-CM

## 2018-03-09 MED ORDER — OXCARBAZEPINE 150 MG PO TABS: 150.0000 mg | ORAL_TABLET | Freq: Two times a day (BID) | ORAL | 6 refills | Status: DC

## 2018-03-09 MED ORDER — DULOXETINE HCL 60 MG PO CPEP
60.0000 mg | ORAL_CAPSULE | Freq: Every day | ORAL | 11 refills | Status: DC
Start: 2018-03-09 — End: 2019-03-10

## 2018-03-09 NOTE — Patient Instructions (Signed)
1.    Reduce gabapentin to 600 mg at night only 2.    For one week cut lamotrigine in half and then stop 3.    Resume Cymbalta 60 mg daily 4.    Start oxcarbazepine one 150 mg pill twice a day. 5.    Return in 6 months or sooner if new or worsening symptoms

## 2018-03-09 NOTE — Progress Notes (Signed)
[facility]  PATIENT: Rhonda Burnett DOB: 11-01-1945  REFERRING DOCTOR OR PCP:  Dr. Daryel Gerald SOURCE: patient, notes from Drs. Gallemore and Chinnasami, lab reports, imaging reports  _________________________________   HISTORICAL  CHIEF COMPLAINT:  Chief Complaint  Patient presents with  . Follow-up    RM 13 with husband.  . Peripheral Neuropathy    Taking lamotrigine 158m BID. Feels she needs to come off the gabapentin (she takes two at bedtime and one in the am) but she cannot stay awake.     HISTORY OF PRESENT ILLNESS:  Rhonda Burnett a 73yo woman with polyneuropathy that started after chemotherapy for breast cancer.  Update 03/09/2018: She was started on lamotrigine 100 mg po bid but she does not think pain is better.    She is also taking gabapentin 600 mg po tid.    She is getting sleepy during the day.     She continues to have a lot of pain in her feet and also in her back and flank.    Cymbalta was helping her depression and pain some but her depression  NCV/EMG 10/20/2017 Impression: This EMG/NCV study is consistent with a length dependent axonal sensorimotor polyneuropathy.  A minimal superimposed chronic lumbosacral radiculopathy cannot be ruled out.   From 10/10/2017:  In 2017, she was diagnosed with right breast cancer.   She had 2 tumors.     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   The diagnosis was first Her-2 positive in one and Her-2 negative I the other according to her and her husband.   That prompted treatment with mastectomy, chemotherapy and radiation.   [facility]Taxotere, Carbopl[facility] and Herceptin) for about 6 month in 2017 followed by radiation in 2017.  She is currently on tamoxifen.    At some point the diagnosis was changed to Her-2 negative for both of the pathologic samples.     She reports that she noted mild numbness in her hands and feet with shooting pain towards the end  of [facility].    She had numbness greater than pain initially.   She was able to tolerate the symptoms well in 2018 but in early 2019, she had the pain worsened.   More recently, she began to have falls. She feels she has continued to worsen progressively with more.    She has had 4-5 falls.  Currently, she has numbness in her feet up to the ankle and numbness in all of her fingers.    She cut her middle finger on the left deep and didn't even feel the cut.     Pain is in the same distribution.  Quality of pain is burning with an aching quality.     Pain is 24/7 but worsens randomly but more consistently with standing or walking.   Her gait is off balance.  She now needs to grab a hold when she is not on a flat even surface.  She looks down as she walks.  She used to be very active with dancing and is unable to do those activities.  She notes no weakness.  Bladder function is unchanged.  Gabapentin 600 mg po tid was started but she does not feel it has helped much.    She sleeps for about 4 hours and then wakes up a couple times the second half of the night.   She is tired and sleepy during the day  and takes naps daily (1 or 2).   She has had some high sugars but has not been diagnosed with DM and last two HgbA1c were 5.3 and 5.4.  X-ray of the lumbar spine showed lower lumbar degenerative changes and a small calcified abdominal aortic aneurysm.  X-ray of the cervical spine was normal.  REVIEW OF SYSTEMS: Constitutional: No fevers, chills, sweats, or change in appetite.  She has fatigue. Eyes: No visual changes, double vision, eye pain Ear, nose and throat: No hearing loss, ear pain, nasal congestion, sore throat Cardiovascular: No chest pain, palpitations Respiratory: No shortness of breath at rest or with exertion.   No wheezes GastrointestinaI: No nausea, vomiting, diarrhea, abdominal pain, fecal incontinence Genitourinary: No dysuria, urinary retention or frequency.   No nocturia. Musculoskeletal: No neck pain, back pain Integumentary: No rash, pruritus, skin lesions Neurological: as above Psychiatric: She notes depression and anxiety. Endocrine: No palpitations, diaphoresis, change in appetite, change in weigh or increased thirst Hematologic/Lymphatic: No anemia, purpura, petechiae. Allergic/Immunologic: No itchy/runny eyes, nasal congestion, recent allergic reactions, rashes  ALLERGIES: Allergies  Allergen Reactions  . Aspirin Other (See Comments)    Ulcer Abdominal pain/ulcers Abdominal pain   . Penicillins Rash, Shortness Of Breath and Swelling    Childhood reaction   . Amlodipine Other (See Comments) and Swelling    Peripheral edema Peripheral edema   . Morphine Nausea And Vomiting  . Iodine   . Metreleptin Other (See Comments)    unknown unknown   . Montelukast     Hallucinations  . Soap     Rash, itching. Can only use white Dove soap.  . Doxycycline Itching  . Lisinopril Cough and Other (See Comments)    cough     HOME MEDICATIONS:  Current Outpatient Medications:  .  acetaminophen (TYLENOL) 500 MG tablet, Take by mouth., Disp: , Rfl:  .  ALPHA LIPOIC ACID PO, Take by mouth., Disp: , Rfl:  .  ezetimibe (ZETIA) 10 MG tablet, , Disp: , Rfl:  .  Flaxseed, Linseed, (FLAXSEED OIL PO), Take by mouth., Disp: , Rfl:  .  gabapentin (NEURONTIN) 600 MG tablet, Take 1 tablet (600 mg total) by mouth 3 (three) times daily. (Patient taking differently: Take 600 mg by mouth 3 (three) times daily. Pt reports she takes two qhs and one q morning), Disp: 90 tablet, Rfl: 11 .  hydrALAZINE (APRESOLINE) 25 MG tablet, TAKE ONE (1) TABLET BY MOUTH TWO (2) TIMES DAILY, Disp: , Rfl:  .  lamoTRIgine (LAMICTAL) 100 MG tablet, Take 1 tablet (100 mg total) by mouth 2 (two) times daily., Disp: 60 tablet, Rfl: 11 .  levothyroxine (SYNTHROID, LEVOTHROID) 25 MCG tablet, Take 25 mcg by mouth daily before breakfast., Disp: , Rfl:  .   losartan-hydrochlorothiazide (HYZAAR) 100-25 MG tablet, Take 1 tablet by mouth daily., Disp: , Rfl:  .  magnesium oxide (MAG-OX) 400 MG tablet, Take by mouth., Disp: , Rfl:  .  metoprolol succinate (TOPROL-XL) 50 MG 24 hr tablet, Take 50 mg by mouth daily. Take with or immediately following a meal., Disp: , Rfl:  .  nitroGLYCERIN (NITROSTAT) 0.4 MG SL tablet, Place 0.4 mg under the tongue every 5 (five) minutes as needed for chest pain., Disp: , Rfl:  .  omeprazole (PRILOSEC) 20 MG capsule, Take 20 mg by mouth daily., Disp: , Rfl:  .  tamoxifen (NOLVADEX) 20 MG tablet, , Disp: , Rfl:  .  DULoxetine (CYMBALTA) 60 MG capsule, Take 1 capsule (60 mg total) by  mouth daily., Disp: 30 capsule, Rfl: 11 .  OXcarbazepine (TRILEPTAL) 150 MG tablet, Take 1 tablet (150 mg total) by mouth 2 (two) times daily., Disp: 60 tablet, Rfl: 6  PAST MEDICAL HISTORY: Past Medical History:  Diagnosis Date  . CKD (chronic kidney disease) stage 2, GFR 60-89 ml/min   . Coronary artery disease   . Edentulous   . GERD (gastroesophageal reflux disease)   . Heart murmur   . Hypertension   . MI (myocardial infarction) ([facility])   . Microalbuminuria   . Peptic ulcer   . Thyroid disease     PAST SURGICAL HISTORY: Past Surgical History:  Procedure Laterality Date  . APPENDECTOMY    . CORONARY ANGIOPLASTY WITH STENT PLACEMENT    . TONSILLECTOMY    . TUBAL LIGATION      FAMILY HISTORY: Family History  Problem Relation Age of Onset  . Diabetes type II Mother   . Hypertension Mother   . Stroke Mother   . Arthritis Mother   . Diabetes type II Father   . Heart disease Father   . Stroke Sister   . Diabetes type II Sister   . Other Sister 8       Polio   . Hypertension Sister     SOCIAL HISTORY:  Social History   Socioeconomic History  . Marital status: Single    Spouse name: Not on file  . Number of children: Not on file  . Years of education: Not on file  . Highest education level: Not on file    Occupational History  . Not on file  Social Needs  . Financial resource strain: Not on file  . Food insecurity:    Worry: Not on file    Inability: Not on file  . Transportation needs:    Medical: Not on file    Non-medical: Not on file  Tobacco Use  . Smoking status: Former Research scientist (life sciences)  . Smokeless tobacco: Never Used  Substance and Sexual Activity  . Alcohol use: Yes    Comment: rare  . Drug use: No  . Sexual activity: Not on file  Lifestyle  . Physical activity:    Days per week: Not on file    Minutes per session: Not on file  . Stress: Not on file  Relationships  . Social connections:    Talks on phone: Not on file    Gets together: Not on file    Attends religious service: Not on file    Active member of club or organization: Not on file    Attends meetings of clubs or organizations: Not on file    Relationship status: Not on file  . Intimate partner violence:    Fear of current or ex partner: Not on file    Emotionally abused: Not on file    Physically abused: Not on file    Forced sexual activity: Not on file  Other Topics Concern  . Not on file  Social History Narrative  . Not on file     PHYSICAL EXAM  Vitals:   03/09/18 1050  BP: 135/65  Pulse: 60  SpO2: 97%  Weight: 196 lb (88.9 kg)  Height: '5\' 6"'  (1.676 m)    Body mass index is 31.64 kg/m.   General: The patient is well-developed and well-nourished and in no acute distress   Neck:   The neck is nontender with good range of motion   Skin: Extremities are without rash but very mild edema.  Musculoskeletal:  Back is nontender  Neurologic Exam  Mental status: The patient is alert and oriented x 3 at the time of the examination. The patient has apparent normal recent and remote memory, with an apparently normal attention span and concentration ability.   Speech is normal.  Cranial nerves: Extraocular movements are full.  Facial strength and sensation was normal.  Trapezius strength was  normal.. No obvious hearing deficits are noted.  Motor:  Muscle bulk is normal.   Tone is normal. Strength is  5 / 5 in all 4 extremities including the toes  Sensory: In the arms, she has about 50% reduction of vibration sensation in the distal fingers and just minimal loss of vibration sensation in the proximal fingers.  There is a gradient of reduce touch and pinprick sensation normal above the wrist.  In the legs, she has no sensation to vibration at the toes and only about 10% sensation at the ankles and 60% sensation at the knees, compared to the proximal hands.  She has reduced sensation to touch and temperature below the knees.  Coordination: Cerebellar testing reveals good finger-nose-finger and reduced heel-to-shin bilaterally.  Gait and station: Station is normal.  She looks down when she walks.  The stride is mildly reduced in the stance is wide.  She cannot tandem walk.  Romberg sign is positive. Reflexes: Deep tendon reflexes are 1 at the biceps and absent elsewhere in the arms.  There are absent in the legs..   Plantar responses are flexor.    DIAGNOSTIC DATA (LABS, IMAGING, TESTING) - I reviewed patient records, labs, notes, testing and imaging myself where available.  Lab Results  Component Value Date   WBC 5.2 12/22/2014   HGB 11.7 (L) 12/22/2014   HCT 35.4 (L) 12/22/2014   MCV 81.2 12/22/2014   PLT 151 12/22/2014      Component Value Date/Time   NA 136 12/22/2014 1110   K 4.0 12/22/2014 1110   CL 106 12/22/2014 1110   CO2 24 12/22/2014 1110   GLUCOSE 85 12/22/2014 1110   BUN 11 12/22/2014 1110   CREATININE 0.72 12/22/2014 1110   CALCIUM 9.0 12/22/2014 1110   PROT 6.2 09/09/2017 1149   ALBUMIN 3.0 (L) 12/22/2014 1110   AST 32 12/22/2014 1110   ALT 24 12/22/2014 1110   ALKPHOS 79 12/22/2014 1110   BILITOT 0.6 12/22/2014 1110   GFRNONAA >60 12/22/2014 1110   GFRAA >60 12/22/2014 1110       ASSESSMENT AND PLAN  Polyneuropathy following chemotherapy  (HCC)  Dysesthesia  Lobular carcinoma of right female breast (HCC)  Depression, unspecified depression type   1.    Reduce gabapentin to 600 mg at night only 2.    For one week cut lamotrigine in half and then stop 3.    Resume Cymbalta 60 mg daily 4.    Start oxcarbazepine one 150 mg pill twice a day. 5.    Return in 6 months or sooner if new or worsening symptoms Aoki Wedemeyer A. Felecia Shelling, MD, [facility] 3/73/4287, 6:81 PM Certified in Neurology, Clinical Neurophysiology, Sleep Medicine, Pain Medicine and Neuroimaging  [facility] [address] [place], [place] [address] [phone]

## 2018-09-07 ENCOUNTER — Ambulatory Visit: Payer: Medicare HMO | Admitting: Neurology

## 2018-10-05 ENCOUNTER — Telehealth: Payer: Self-pay | Admitting: *Deleted

## 2018-10-05 NOTE — Telephone Encounter (Signed)
Rhonda Burnett, LVM for pt letting her know I was calling to see if she could come in at 9am to see Dr. Felecia Shelling. He had cx and she was on wait list. Advised I will keep her on wait list and call back if anything else opens.

## 2018-10-05 NOTE — Telephone Encounter (Signed)
Pt's husband Glendell Docker (on [place]) returned the call. He stated the pt is still asleep and they have a 45 minute drive, so they cannot make it by 9 AM but are very appreciative for the call. They would like to be called if anything else opens up.

## 2018-10-13 ENCOUNTER — Other Ambulatory Visit: Payer: Self-pay | Admitting: Hematology and Oncology

## 2018-10-13 DIAGNOSIS — S22000A Wedge compression fracture of unspecified thoracic vertebra, initial encounter for closed fracture: Secondary | ICD-10-CM

## 2018-10-13 DIAGNOSIS — G8929 Other chronic pain: Secondary | ICD-10-CM

## 2018-10-13 DIAGNOSIS — M546 Pain in thoracic spine: Secondary | ICD-10-CM

## 2018-10-14 ENCOUNTER — Encounter: Payer: Self-pay | Admitting: *Deleted

## 2018-10-14 ENCOUNTER — Ambulatory Visit
Admission: RE | Admit: 2018-10-14 | Discharge: 2018-10-14 | Disposition: A | Discharge status: Home / Self Care | Payer: Medicare HMO | Source: Ambulatory Visit | Attending: Hematology and Oncology | Admitting: Hematology and Oncology

## 2018-10-14 DIAGNOSIS — S22000A Wedge compression fracture of unspecified thoracic vertebra, initial encounter for closed fracture: Secondary | ICD-10-CM

## 2018-10-14 DIAGNOSIS — G8929 Other chronic pain: Secondary | ICD-10-CM

## 2018-10-14 HISTORY — PX: IR RADIOLOGIST EVAL & MGMT: IMG5224

## 2018-10-14 NOTE — Consult Note (Signed)
Chief Complaint: Patient was seen in consultation today for pathologic thoracic T4 compression fracture at the request of Chinnasami,Bernard  Referring Physician(s): Chinnasami,Bernard  History of Present Illness: Rhonda Burnett is a 73 y.o. female history of right breast carcinoma status post resection, adjuvant chemotherapy, HER-2 therapy, now on tamoxifen. She describes about 1 year of upper thoracic pain.  This has been exacerbated over the past 5 months. 09/23/2018 bone scan demonstrated uptake in bilateral ribs and T4 vertebral body suggesting osseous metastatic disease 10/11/2018 MR thoracic spine demonstrates metastatic disease T3, T4, T5, T6 with mild compression fracture deformity of inferior plate T4.  There is foraminal encroachment of tumor at T4-5.  She feels that her pain is incompletely controlled with her current narcotic pain medication regimen. No new bowel or bladder control issues.  No arm or leg weakness or numbness. Past Medical History:  Diagnosis Date  . CKD (chronic kidney disease) stage 2, GFR 60-89 ml/min   . Coronary artery disease   . Edentulous   . GERD (gastroesophageal reflux disease)   . Heart murmur   . Hypertension   . MI (myocardial infarction) ([place])   . Microalbuminuria   . Peptic ulcer   . Thyroid disease     Past Surgical History:  Procedure Laterality Date  . APPENDECTOMY    . CORONARY ANGIOPLASTY WITH STENT PLACEMENT    . TONSILLECTOMY    . TUBAL LIGATION      Allergies: Aspirin, Penicillins, Amlodipine, Morphine, Iodine, Metreleptin, Montelukast, Soap, Doxycycline, and Lisinopril  Medications: Prior to Admission medications   Medication Sig Start Date End Date Taking? Authorizing Provider  acetaminophen (TYLENOL) 500 MG tablet Take by mouth.    [provider]  ALPHA LIPOIC ACID PO Take by mouth.    [provider]  DULoxetine (CYMBALTA) 60 MG capsule Take 1 capsule (60 mg total) by mouth daily.  03/09/18   Sater, Nanine Means, MD  ezetimibe (ZETIA) 10 MG tablet  08/07/17   [provider]  Flaxseed, Linseed, (FLAXSEED OIL PO) Take by mouth.    [provider]  gabapentin (NEURONTIN) 600 MG tablet Take 1 tablet (600 mg total) by mouth 3 (three) times daily. Patient taking differently: Take 600 mg by mouth 3 (three) times daily. Pt reports she takes two qhs and one q morning 10/20/17   Sater, Nanine Means, MD  hydrALAZINE (APRESOLINE) 25 MG tablet TAKE ONE (1) TABLET BY MOUTH TWO (2) TIMES DAILY 02/23/15   [provider]  lamoTRIgine (LAMICTAL) 100 MG tablet Take 1 tablet (100 mg total) by mouth 2 (two) times daily. 09/09/17   Sater, Nanine Means, MD  levothyroxine (SYNTHROID, LEVOTHROID) 25 MCG tablet Take 25 mcg by mouth daily before breakfast.    [provider]  losartan-hydrochlorothiazide (HYZAAR) 100-25 MG tablet Take 1 tablet by mouth daily.    [provider]  magnesium oxide (MAG-OX) 400 MG tablet Take by mouth. 11/21/16   [provider]  metoprolol succinate (TOPROL-XL) 50 MG 24 hr tablet Take 50 mg by mouth daily. Take with or immediately following a meal.    [provider]  nitroGLYCERIN (NITROSTAT) 0.4 MG SL tablet Place 0.4 mg under the tongue every 5 (five) minutes as needed for chest pain.    [provider]  omeprazole (PRILOSEC) 20 MG capsule Take 20 mg by mouth daily.    [provider]  OXcarbazepine (TRILEPTAL) 150 MG tablet Take 1 tablet (150 mg total) by  mouth 2 (two) times daily. 03/09/18   Sater, Nanine Means, MD  tamoxifen (NOLVADEX) 20 MG tablet  06/15/17   [provider]     Family History  Problem Relation Age of Onset  . Diabetes type II Mother   . Hypertension Mother   . Stroke Mother   . Arthritis Mother   . Diabetes type II Father   . Heart disease Father   . Stroke Sister   . Diabetes type II Sister   . Other Sister 8       Polio   . Hypertension Sister     Social History    Socioeconomic History  . Marital status: Single    Spouse name: Not on file  . Number of children: Not on file  . Years of education: Not on file  . Highest education level: Not on file  Occupational History  . Not on file  Social Needs  . Financial resource strain: Not on file  . Food insecurity    Worry: Not on file    Inability: Not on file  . Transportation needs    Medical: Not on file    Non-medical: Not on file  Tobacco Use  . Smoking status: Former Research scientist (life sciences)  . Smokeless tobacco: Never Used  Substance and Sexual Activity  . Alcohol use: Yes    Comment: rare  . Drug use: No  . Sexual activity: Not on file  Lifestyle  . Physical activity    Days per week: Not on file    Minutes per session: Not on file  . Stress: Not on file  Relationships  . Social Herbalist on phone: Not on file    Gets together: Not on file    Attends religious service: Not on file    Active member of club or organization: Not on file    Attends meetings of clubs or organizations: Not on file    Relationship status: Not on file  Other Topics Concern  . Not on file  Social History Narrative  . Not on file    ECOG Status: 1 - Symptomatic but completely ambulatory    Physical Exam Constitutional: Oriented to person, place, and time. Well-developed and well-nourished. No distress.  Vital Signs: BP (!) 151/77 (BP Location: Left Arm)   Pulse 94   Temp 98.4 F (36.9 C)   SpO2 96%  HENT:  Head: Normocephalic and atraumatic.  Eyes: Conjunctivae and EOM are normal. Right eye exhibits no discharge. Left eye exhibits no discharge. No scleral icterus.  Neck: No JVD present.  Pulmonary/Chest: Effort normal. No stridor. No respiratory distress.  Minimal tenderness over upper thoracic spine. Abdomen: soft, non distended Neurological:  alert and oriented to person, place, and time.  Skin: Skin is warm and dry.  not diaphoretic.  Psychiatric:   normal mood and affect.   behavior is  normal. Judgment and thought content normal.   Mallampati Score: Review of Systems  Review of Systems: A 12 point ROS discussed and pertinent positives are indicated in the HPI above.  All other systems are negative.      Imaging: EXAM: MRI THORACIC WITHOUT AND WITH CONTRAST  TECHNIQUE: Multiplanar and multiecho pulse sequences of the thoracic spine were obtained without and with intravenous contrast.  CONTRAST: 17 mL MultiHance  COMPARISON: CT of the chest 09/23/2018. Whole-body bone scan 09/23/2018.  FINDINGS: MRI THORACIC SPINE FINDINGS  Alignment: AP alignment is anatomic. There is some exaggeration of the normal thoracic  kyphosis. No significant listhesis is present.  Vertebrae: Infiltrative disease begins at the inferior aspect of T3 and extends through the T6 vertebral body. Disease is more prominent left than right. There is diffuse infiltration of T4 with pathologic fracture. The there is 50% loss of height anteriorly. Minimal fracture is present along the superior endplate of T5. T3 and T6 vertebral body heights are preserved.  Tumor extends into the posterior elements T4, T5, and T6. Enhancing tumor is present within the left foramen inferiorly at T3-4 and diffusely at T4-5. There is some tumor into the foramen on the left at T5-6. Is diffuse tumor along the posterior left chest wall and left T4 rib.  No other focal lesions are present.  Cord: Tumor at T4-5 distorts the ventral surface of the cord. The canal is widely patent posteriorly. Normal signal is present throughout the thoracic spinal cord to the conus medullaris which terminates at T12-L1, within normal limits.  Paraspinal and other soft tissues: Left chest wall tumor is noted posteriorly from T3 through T6. This is most prominent along the left 4th rib.  Disc levels:  T1-2: Negative.  T2-3: There is slight in car ran of a broad-based disc protrusion. Mild facet hypertrophy is noted. No  significant stenosis is present.  T3-4: There is tumor in the foramen on the left encroaching on the nerve.  T4-5: There is tumor grown into the ventral epidural space. Epidural tumor is present on the left into the left foramen surrounding the nerve. The right foramen is patent.  C5-6: Tumor extends into the superior aspect of the foramen with some impact on the nerve. The central canal is patent. Right foramen is patent.  T6-7: There is no epidural tumor. The foramina are patent.  Facet hypertrophy is present bilaterally at T9-10 and T10-11 without significant crow chin on the canal or foramina. No other significant disc disease or stenosis is present.  IMPRESSION: 1. 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 2. Pathologic fracture with 50% loss of height along the anterior inferior endplate of T4. 3. Extensive left-sided tumor extending into the epidural space most notably at T4-5 with encroachment of the spinal cord on the left side. There is no significant central canal stenosis. 4. Extensive tumor within the foramen on the left at T4-5. 5. Partial encroachment of the foramina on the left at T3-4 and T5-6. 6. Extensive metastatic disease along the left posterior chest wall and left fourth rib.   Electronically Signed By: San Morelle M.D. On: 10/11/2018 15:51    Labs:  CBC: No results for input(s): WBC, HGB, HCT, PLT in the last 8760 hours.  COAGS: No results for input(s): INR, APTT in the last 8760 hours.  BMP: No results for input(s): NA, K, CL, CO2, GLUCOSE, BUN, CALCIUM, CREATININE, GFRNONAA, GFRAA in the last 8760 hours.  Invalid input(s): CMP  LIVER FUNCTION TESTS: No results for input(s): BILITOT, AST, ALT, ALKPHOS, PROT, ALBUMIN in the last 8760 hours.  TUMOR MARKERS: No results for input(s): AFPTM, CEA, CA199, CHROMGRNA in the last 8760 hours.  Assessment and Plan:  My impression is that this patient has probable  metastatic breast cancer involving multiple upper thoracic levels, with a mild pathologic compression fracture of T4 corresponding to focus of normal activity on bone scintigraphy, likely source of her continued pain.  The lesion is approachable for percutaneous kyphoplasty/osteo-cool.  Percutaneous core biopsy could be obtained concurrently to confirm the clinical and imaging suspicion of metastatic breast  carcinoma. I discussed with the patient and her spouse the pathophysiology of vertebral compression fractures specifically pathologic fractures in the setting of metastatic disease.  We discussed treatment options including watchful waiting, external radiation therapy, percutaneous kyphoplasty/vertebroplasty, osteo-cool, and surgical fixation.  We discussed in detail the kyphoplasty and osteo-cool RF ablation technique, anticipated benefits, time course to anticipated symptom resolution, possible risks and complications including nontarget cement deposition, cord or nerve injury, infection, lack of adequate pain response.  They seem to understand and had their questions answered. She is motivated to proceed.  Accordingly, we can set her up first available for treatment of this T4 level.  She states that she is scheduled to see radiation oncologist with concurrent treatment indicated given the additional levels of involvement as well as to encroachment on the neural foramina. I will be gone next week, but she is willing to see 1 of my qualified partners for the procedure if this would  expedite her care, and I concur with this plan.  Thank you for this interesting consult.  I greatly enjoyed meeting Rhonda Burnett and look forward to participating in their care.  A copy of this report was sent to the requesting provider on this date.  Electronically Signed: Rickard Rhymes 10/14/2018, 4:17 PM   I spent a total of  40 Minutes   in face to face in clinical consultation, greater than 50% of which  was counseling/coordinating care for pathologic T4 compression fracture.

## 2018-11-09 ENCOUNTER — Ambulatory Visit (INDEPENDENT_AMBULATORY_CARE_PROVIDER_SITE_OTHER): Payer: Medicare HMO | Admitting: Neurology

## 2018-11-09 ENCOUNTER — Other Ambulatory Visit: Payer: Self-pay

## 2018-11-09 VITALS — BP 155/72 | HR 57 | Temp 97.9°F | Ht 66.0 in | Wt 178.4 lb

## 2018-11-09 DIAGNOSIS — C50911 Malignant neoplasm of unspecified site of right female breast: Secondary | ICD-10-CM

## 2018-11-09 DIAGNOSIS — M8448XA Pathological fracture, other site, initial encounter for fracture: Secondary | ICD-10-CM | POA: Insufficient documentation

## 2018-11-09 DIAGNOSIS — M8448XD Pathological fracture, other site, subsequent encounter for fracture with routine healing: Secondary | ICD-10-CM

## 2018-11-09 DIAGNOSIS — R208 Other disturbances of skin sensation: Secondary | ICD-10-CM

## 2018-11-09 DIAGNOSIS — G620 Drug-induced polyneuropathy: Secondary | ICD-10-CM

## 2018-11-09 MED ORDER — GABAPENTIN 600 MG PO TABS: ORAL_TABLET | ORAL | 3 refills | Status: AC

## 2018-11-09 MED ORDER — OXCARBAZEPINE 150 MG PO TABS: 150.0000 mg | ORAL_TABLET | Freq: Two times a day (BID) | ORAL | 3 refills | Status: AC

## 2018-11-09 NOTE — Progress Notes (Signed)
[facility]  PATIENT: Rhonda Burnett DOB: 1946-01-01  REFERRING DOCTOR OR PCP:  Dr. Daryel Gerald SOURCE: patient, notes from Drs. Gallemore and Chinnasami, lab reports, imaging reports  _________________________________   HISTORICAL  CHIEF COMPLAINT:  Chief Complaint  Patient presents with  . Follow-up    RM 12 with husband (temp: 97.8). Last seen 03/09/2018.     HISTORY OF PRESENT ILLNESS:  Rhonda Burnett is a 74 yo woman with polyneuropathy that started after chemotherapy for breast cancer.  Update 11/09/18: She has pain in her legs in the feet, ankle and up to her knees.    Her balance is worse recently.  Balance does even worse in the dark or if eyes are closed.   She is taking gabapentin 600 mg at bedtime only as higher dose makes her feel off balanced.    Lamotrigine had not helped.   Oxcarbazepine helps pain some.  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  Back pain is much worse than the   She has had 9 of 10 radiation therapy sessions.   Oxycodone was increased.    It helps the back and also helps  Update 03/09/2018: She was started on lamotrigine 100 mg po bid but she does not think pain is better.    She is also taking gabapentin 600 mg po tid.    She is getting sleepy during the day.     She continues to have a lot of pain in her feet and also in her back and flank.    Cymbalta was helping her depression and pain some but her depression  NCV/EMG 10/20/2017 Impression: This EMG/NCV study is consistent with a length dependent axonal sensorimotor polyneuropathy.  A minimal superimposed chronic lumbosacral radiculopathy cannot be ruled out.   From 10/10/2017:  In 2017, she was diagnosed with right breast cancer.   She had 2 tumors.     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   The diagnosis was first Her-2 positive in one and Her-2 negative I the  other according to her and her husband.   That prompted treatment with mastectomy, chemotherapy and radiation.   She was treated with [facility] chemotherapy (Taxotere, Carboplatin and Herceptin) for about 6 month in 2017 followed by radiation in 2017.  She is currently on tamoxifen.    At some point the diagnosis was changed to Her-2 negative for both of the pathologic samples.     She reports that she noted mild numbness in her hands and feet with shooting pain towards the end of the 6 months of [place] chemotherapy.    She had numbness greater than pain initially.   She was able to tolerate the symptoms well in 2018 but in early 2019, she had the pain worsened.   More recently, she began to have falls. She feels she has continued to worsen progressively with more.    She has had 4-5 falls.  Currently, she has numbness in her feet up to the ankle and numbness in all of her fingers.    She cut her middle finger on the left deep and didn't even feel the cut.     Pain is in the same distribution.  Quality of pain is burning with an aching quality.     Pain is 24/7 but worsens randomly but more consistently with standing or walking.   Her gait  is off balance.  She now needs to grab a hold when she is not on a flat even surface.  She looks down as she walks.  She used to be very active with dancing and is unable to do those activities.  She notes no weakness.  Bladder function is unchanged.  Gabapentin 600 mg po tid was started but she does not feel it has helped much.    She sleeps for about 4 hours and then wakes up a couple times the second half of the night.   She is tired and sleepy during the day and takes naps daily (1 or 2).   She has had some high sugars but has not been diagnosed with DM and last two HgbA1c were 5.3 and 5.4.  X-ray of the lumbar spine showed lower lumbar degenerative changes and a small calcified abdominal aortic aneurysm.  X-ray of the cervical spine was normal.  REVIEW OF SYSTEMS:  Constitutional: No fevers, chills, sweats, or change in appetite.  She has fatigue. Eyes: No visual changes, double vision, eye pain Ear, nose and throat: No hearing loss, ear pain, nasal congestion, sore throat Cardiovascular: No chest pain, palpitations Respiratory: No shortness of breath at rest or with exertion.   No wheezes GastrointestinaI: No nausea, vomiting, diarrhea, abdominal pain, fecal incontinence Genitourinary: No dysuria, urinary retention or frequency.  No nocturia. Musculoskeletal: No neck pain, back pain Integumentary: No rash, pruritus, skin lesions Neurological: as above Psychiatric: She notes depression and anxiety. Endocrine: No palpitations, diaphoresis, change in appetite, change in weigh or increased thirst Hematologic/Lymphatic: No anemia, purpura, petechiae. Allergic/Immunologic: No itchy/runny eyes, nasal congestion, recent allergic reactions, rashes  ALLERGIES: Allergies  Allergen Reactions  . Aspirin Other (See Comments)    Ulcer Abdominal pain/ulcers Abdominal pain   . Penicillins Rash, Shortness Of Breath and Swelling    Childhood reaction   . Amlodipine Other (See Comments) and Swelling    Peripheral edema Peripheral edema   . Morphine Nausea And Vomiting  . Iodine   . Metreleptin Other (See Comments)    unknown unknown   . Montelukast     Hallucinations  . Soap     Rash, itching. Can only use white Dove soap.  . Doxycycline Itching  . Lisinopril Cough and Other (See Comments)    cough     HOME MEDICATIONS:  Current Outpatient Medications:  .  acetaminophen (TYLENOL) 500 MG tablet, Take by mouth., Disp: , Rfl:  .  ALPHA LIPOIC ACID PO, Take by mouth., Disp: , Rfl:  .  DULoxetine (CYMBALTA) 60 MG capsule, Take 1 capsule (60 mg total) by mouth daily., Disp: 30 capsule, Rfl: 11 .  ezetimibe (ZETIA) 10 MG tablet, , Disp: , Rfl:  .  Flaxseed, Linseed, (FLAXSEED OIL PO), Take by mouth., Disp: , Rfl:  .  gabapentin (NEURONTIN) 600  MG tablet, 1 to 1.5 po qHS, Disp: 135 tablet, Rfl: 3 .  hydrALAZINE (APRESOLINE) 25 MG tablet, TAKE ONE (1) TABLET BY MOUTH TWO (2) TIMES DAILY, Disp: , Rfl:  .  lamoTRIgine (LAMICTAL) 100 MG tablet, Take 1 tablet (100 mg total) by mouth 2 (two) times daily., Disp: 60 tablet, Rfl: 11 .  levothyroxine (SYNTHROID, LEVOTHROID) 25 MCG tablet, Take 25 mcg by mouth daily before breakfast., Disp: , Rfl:  .  losartan-hydrochlorothiazide (HYZAAR) 100-25 MG tablet, Take 1 tablet by mouth daily., Disp: , Rfl:  .  magnesium oxide (MAG-OX) 400 MG tablet, Take by mouth., Disp: , Rfl:  .  metoprolol  succinate (TOPROL-XL) 50 MG 24 hr tablet, Take 50 mg by mouth daily. Take with or immediately following a meal., Disp: , Rfl:  .  nitroGLYCERIN (NITROSTAT) 0.4 MG SL tablet, Place 0.4 mg under the tongue every 5 (five) minutes as needed for chest pain., Disp: , Rfl:  .  omeprazole (PRILOSEC) 20 MG capsule, Take 20 mg by mouth daily., Disp: , Rfl:  .  OXcarbazepine (TRILEPTAL) 150 MG tablet, Take 1 tablet (150 mg total) by mouth 2 (two) times daily., Disp: 180 tablet, Rfl: 3 .  Oxycodone HCl 10 MG TABS, Take 10 mg by mouth every 6 (six) hours as needed., Disp: , Rfl:  .  tamoxifen (NOLVADEX) 20 MG tablet, , Disp: , Rfl:   PAST MEDICAL HISTORY: Past Medical History:  Diagnosis Date  . CKD (chronic kidney disease) stage 2, GFR 60-89 ml/min   . Coronary artery disease   . Edentulous   . GERD (gastroesophageal reflux disease)   . Heart murmur   . Hypertension   . MI (myocardial infarction) ([place])   . Microalbuminuria   . Peptic ulcer   . Thyroid disease     PAST SURGICAL HISTORY: Past Surgical History:  Procedure Laterality Date  . APPENDECTOMY    . CORONARY ANGIOPLASTY WITH STENT PLACEMENT    . IR RADIOLOGIST EVAL & MGMT  10/14/2018  . TONSILLECTOMY    . TUBAL LIGATION      FAMILY HISTORY: Family History  Problem Relation Age of Onset  . Diabetes type II Mother   . Hypertension Mother   . Stroke  Mother   . Arthritis Mother   . Diabetes type II Father   . Heart disease Father   . Stroke Sister   . Diabetes type II Sister   . Other Sister 8       Polio   . Hypertension Sister     SOCIAL HISTORY:  Social History   Socioeconomic History  . Marital status: Single    Spouse name: Not on file  . Number of children: Not on file  . Years of education: Not on file  . Highest education level: Not on file  Occupational History  . Not on file  Social Needs  . Financial resource strain: Not on file  . Food insecurity    Worry: Not on file    Inability: Not on file  . Transportation needs    Medical: Not on file    Non-medical: Not on file  Tobacco Use  . Smoking status: Former Research scientist (life sciences)  . Smokeless tobacco: Never Used  Substance and Sexual Activity  . Alcohol use: Yes    Comment: rare  . Drug use: No  . Sexual activity: Not on file  Lifestyle  . Physical activity    Days per week: Not on file    Minutes per session: Not on file  . Stress: Not on file  Relationships  . Social Herbalist on phone: Not on file    Gets together: Not on file    Attends religious service: Not on file    Active member of club or organization: Not on file    Attends meetings of clubs or organizations: Not on file    Relationship status: Not on file  . Intimate partner violence    Fear of current or ex partner: Not on file    Emotionally abused: Not on file    Physically abused: Not on file    Forced sexual activity:  Not on file  Other Topics Concern  . Not on file  Social History Narrative  . Not on file     PHYSICAL EXAM  Vitals:   11/09/18 1142  BP: (!) 155/72  Pulse: (!) 57  Temp: 97.9 F (36.6 C)  Weight: 178 lb 6.4 oz (80.9 kg)  Height: 5' 6" (1.676 m)    Body mass index is 28.79 kg/m.   General: The patient is well-developed and well-nourished and in no acute distress   Neck:   The neck is nontender with good range of motion   Skin: Extremities are  without rash but very mild edema.  Musculoskeletal:  Back is nontender  Neurologic Exam  Mental status: The patient is alert and oriented x 3 at the time of the examination. The patient has apparent normal recent and remote memory, with an apparently normal attention span and concentration ability.   Speech is normal.  Cranial nerves: Extraocular movements are full.  Facial strength and sensation was normal.  Trapezius strength was normal.. No obvious hearing deficits are noted.  Motor:  Muscle bulk is normal.   Tone is normal. Strength is  5 / 5 in all 4 extremities including the toes  Sensory: In the arms, she has about 50% reduction of vibration sensation in the distal fingers and just minimal loss of vibration sensation in the proximal fingers.  There is a gradient of reduce touch and pinprick sensation normal above the wrist.  In the legs, she has no sensation to vibration at the toes and only about 10% sensation at the ankles and 60% sensation at the knees, compared to the proximal hands.  She has reduced sensation to touch and temperature below the knees.  Coordination: Cerebellar testing reveals good finger-nose-finger and reduced heel-to-shin bilaterally.  Gait and station: Station is normal.  She looks down when she walks.  The stride is mildly reduced in the stance is wide.  She cannot tandem walk.  Romberg Sign Is Positive.  Reflexes: Deep tendon reflexes are 1 at the biceps and absent elsewhere in the arms.  There are absent in the legs..      ASSESSMENT AND PLAN  Polyneuropathy following chemotherapy ([place])  Dysesthesia  Lobular carcinoma of right female breast ([place])  Pathological fracture of thoracic vertebra with routine healing, subsequent encounter   1.   Continue gabapentin 600 mg nightly.  She can increase the dose to 900 mg but go back to 600 mg if she feels more off balance. 2.    Continue Cymbalta 60 mg daily 3.    Continue oxcarbazepine one 150 mg pill twice  a day. 4.   She is going to follow-up with oncology about next steps. 5.    Return in 12 months or sooner if new or worsening symptoms Richard A. Felecia Shelling, MD, [facility] 80/16/5537, 48:27 PM Certified in Neurology, Clinical Neurophysiology, Sleep Medicine, Pain Medicine and Neuroimaging  [facility] [address], [place], [place] [address] [phone]

## 2019-03-10 ENCOUNTER — Other Ambulatory Visit: Payer: Self-pay | Admitting: Neurology

## 2019-11-09 ENCOUNTER — Ambulatory Visit: Payer: Medicare HMO | Admitting: Neurology

## 2020-02-21 DEATH — deceased
